# Patient Record
Sex: Female | Born: 1935
Health system: Southern US, Community
[De-identification: ages and names within clinical notes are randomized; demographics above are authoritative.]

## PROBLEM LIST (undated history)

## (undated) DIAGNOSIS — F0281 Dementia in other diseases classified elsewhere with behavioral disturbance: Secondary | ICD-10-CM

## (undated) DIAGNOSIS — I10 Essential (primary) hypertension: Secondary | ICD-10-CM

## (undated) DIAGNOSIS — G309 Alzheimer's disease, unspecified: Secondary | ICD-10-CM

## (undated) DIAGNOSIS — F02818 Dementia in other diseases classified elsewhere, unspecified severity, with other behavioral disturbance: Secondary | ICD-10-CM

## (undated) DIAGNOSIS — C50919 Malignant neoplasm of unspecified site of unspecified female breast: Secondary | ICD-10-CM

## (undated) HISTORY — PX: ABDOMINAL HYSTERECTOMY: SHX81

## (undated) HISTORY — DX: Dementia in other diseases classified elsewhere, unspecified severity, with other behavioral disturbance: F02.818

## (undated) HISTORY — DX: Essential (primary) hypertension: I10

## (undated) HISTORY — DX: Alzheimer's disease, unspecified: G30.9

## (undated) HISTORY — PX: MASTECTOMY: SHX3

## (undated) HISTORY — DX: Malignant neoplasm of unspecified site of unspecified female breast: C50.919

## (undated) HISTORY — PX: BREAST RECONSTRUCTION: SHX9

## (undated) HISTORY — DX: Dementia in other diseases classified elsewhere with behavioral disturbance: F02.81

---
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
## 1983-03-19 HISTORY — PX: BREAST BIOPSY: SHX20

## 2010-11-30 LAB — TB SKIN TEST: TB Skin Test: NEGATIVE mm

## 2011-02-15 ENCOUNTER — Encounter: Payer: Self-pay | Admitting: Internal Medicine

## 2011-02-15 ENCOUNTER — Ambulatory Visit (INDEPENDENT_AMBULATORY_CARE_PROVIDER_SITE_OTHER): Payer: Medicare Other | Admitting: Internal Medicine

## 2011-02-15 VITALS — BP 142/70 | HR 67 | Temp 97.9°F | Wt 120.0 lb

## 2011-02-15 DIAGNOSIS — F0390 Unspecified dementia without behavioral disturbance: Secondary | ICD-10-CM

## 2011-02-15 DIAGNOSIS — Z0000 Encounter for general adult medical examination without abnormal findings: Secondary | ICD-10-CM

## 2011-02-15 DIAGNOSIS — D510 Vitamin B12 deficiency anemia due to intrinsic factor deficiency: Secondary | ICD-10-CM

## 2011-02-15 DIAGNOSIS — E785 Hyperlipidemia, unspecified: Secondary | ICD-10-CM

## 2011-02-15 LAB — CBC WITH DIFFERENTIAL/PLATELET
Basophils Relative: 0.9 % (ref 0.0–3.0)
Eosinophils Absolute: 0.3 10*3/uL (ref 0.0–0.7)
Hemoglobin: 14.5 g/dL (ref 12.0–15.0)
Lymphocytes Relative: 21.9 % (ref 12.0–46.0)
MCHC: 33.7 g/dL (ref 30.0–36.0)
MCV: 95 fl (ref 78.0–100.0)
Neutro Abs: 3.8 10*3/uL (ref 1.4–7.7)
RBC: 4.54 Mil/uL (ref 3.87–5.11)

## 2011-02-15 LAB — COMPREHENSIVE METABOLIC PANEL
AST: 26 U/L (ref 0–37)
BUN: 17 mg/dL (ref 6–23)
Calcium: 9.2 mg/dL (ref 8.4–10.5)
Chloride: 103 mEq/L (ref 96–112)
Creatinine, Ser: 0.7 mg/dL (ref 0.4–1.2)
GFR: 82.02 mL/min (ref >60.00)
Glucose, Bld: 97 mg/dL (ref 70–99)

## 2011-02-15 LAB — LIPID PANEL
Cholesterol: 210 mg/dL — ABNORMAL HIGH (ref 0–200)
HDL: 82.6 mg/dL (ref >39.00)
Triglycerides: 73 mg/dL (ref 0.0–149.0)

## 2011-02-15 LAB — VITAMIN B12: Vitamin B-12: 419 pg/mL (ref 211–911)

## 2011-02-15 NOTE — Progress Notes (Signed)
  Subjective:    Patient ID: Emily Summers, female    DOB: [birth year], 77 y.o.   MRN: [id number]  HPI 77YO female presents to establish care. Denies any complaints today. Oriented to self, but not time or place, so unable to provide information about medications, medical history, etc. Caregiver with her also unable to provide any history.   Review of Systems  Unable to perform ROS: Dementia   BP 142/70  Pulse 67  Temp(Src) 97.9 F (36.6 C) (Oral)  Wt 120 lb (54.432 kg)  SpO2 98%     Objective:   Physical Exam  Constitutional: She appears well-developed and well-nourished. No distress.  HENT:  Head: Normocephalic and atraumatic.  Right Ear: External ear normal.  Left Ear: External ear normal.  Nose: Nose normal.  Mouth/Throat: Oropharynx is clear and moist. No oropharyngeal exudate.  Eyes: Conjunctivae are normal. Pupils are equal, round, and reactive to light. Right eye exhibits no discharge. Left eye exhibits no discharge. No scleral icterus.  Neck: Normal range of motion. Neck supple. No tracheal deviation present. No thyromegaly present.  Cardiovascular: Normal rate, regular rhythm, normal heart sounds and intact distal pulses.  Exam reveals no gallop and no friction rub.   No murmur heard. Pulmonary/Chest: Effort normal and breath sounds normal. No respiratory distress. She has no wheezes. She has no rales. She exhibits no tenderness.  Abdominal: Soft. Bowel sounds are normal. She exhibits no distension and no mass. There is no tenderness. There is no rebound and no guarding.  Musculoskeletal: Normal range of motion. She exhibits no edema and no tenderness.  Lymphadenopathy:    She has no cervical adenopathy.  Neurological: She is alert. No cranial nerve deficit. She exhibits normal muscle tone. Coordination normal.  Skin: Skin is warm and dry. No rash noted. She is not diaphoretic. No erythema. No pallor.  Psychiatric: She has a normal mood and affect. Her speech is normal  and behavior is normal. Thought content is delusional. Cognition and memory are impaired. She expresses inappropriate judgment. She exhibits abnormal recent memory and abnormal remote memory.       Pt reports that she lives in [place] and is just visiting. Notes that nurse with her is a "friend from [place]."           Assessment & Plan:  1. Dementia - No records available for pt at this time. Will request records from previous physician. Pt unable to give medical history. Denies any complaints today. Unsure if she is taking any medication and caregiver with her also unsure.  Will check CBC, CMP, lipids, B12, TSH today. Follow up in 3 months and as needed.

## 2011-02-22 ENCOUNTER — Telehealth: Payer: Self-pay | Admitting: *Deleted

## 2011-02-22 NOTE — Telephone Encounter (Signed)
Someone at nursing facility left VM - pt c/o cough, congestion, low grade fever (99.4) and diarrhea. Need to call caregivers to discuss.

## 2011-02-22 NOTE — Telephone Encounter (Signed)
Left mess for nurse to call office back.

## 2011-02-25 NOTE — Telephone Encounter (Signed)
Pharm called questioning dose of aricept, also says patient is only on namenda 10 mg once daily - Pt needs RFs, will call assisted living facility again to check on patient and get med list.

## 2011-02-26 ENCOUNTER — Telehealth: Payer: Self-pay | Admitting: *Deleted

## 2011-02-26 NOTE — Telephone Encounter (Signed)
Error

## 2011-02-27 MED ORDER — DONEPEZIL HCL 10 MG PO TABS: 10.0000 mg | ORAL_TABLET | Freq: Every evening | ORAL | Status: DC | PRN

## 2011-02-27 MED ORDER — MEMANTINE HCL 10 MG PO TABS: 10.0000 mg | ORAL_TABLET | Freq: Every day | ORAL | Status: DC

## 2011-07-22 DIAGNOSIS — L821 Other seborrheic keratosis: Secondary | ICD-10-CM | POA: Diagnosis not present

## 2011-07-22 DIAGNOSIS — L820 Inflamed seborrheic keratosis: Secondary | ICD-10-CM | POA: Diagnosis not present

## 2011-08-22 ENCOUNTER — Other Ambulatory Visit: Payer: Self-pay | Admitting: Internal Medicine

## 2011-08-29 ENCOUNTER — Other Ambulatory Visit: Payer: Self-pay | Admitting: Internal Medicine

## 2011-11-05 DIAGNOSIS — M2040 Other hammer toe(s) (acquired), unspecified foot: Secondary | ICD-10-CM | POA: Diagnosis not present

## 2011-11-05 DIAGNOSIS — M898X9 Other specified disorders of bone, unspecified site: Secondary | ICD-10-CM | POA: Diagnosis not present

## 2011-11-05 DIAGNOSIS — M2010 Hallux valgus (acquired), unspecified foot: Secondary | ICD-10-CM | POA: Diagnosis not present

## 2011-11-13 DIAGNOSIS — F0390 Unspecified dementia without behavioral disturbance: Secondary | ICD-10-CM | POA: Diagnosis not present

## 2012-03-30 ENCOUNTER — Telehealth: Payer: Self-pay | Admitting: Internal Medicine

## 2012-03-30 NOTE — Telephone Encounter (Signed)
I spoke with Emily Summers and she stated that she faxed the form this morning and she mailed the hard copy this morning.  I advised her that as soon as Dr. Dan Humphreys signs the fax I will fax it back to her today.

## 2012-03-30 NOTE — Telephone Encounter (Signed)
Emily Summers from [facility] is calling about an order that she faxed to the office to be signed by Dr Dan Humphreys.  Caller states she has not received the fax back and she is also looking for the hard copy of the order that she mailed and needs mailed back.  Emily Summers is following up to make sure fax/hard copy was received by the office.  OFFICE PLEASE FOLLOW UP WITH CALLER.

## 2012-05-05 DIAGNOSIS — Z0000 Encounter for general adult medical examination without abnormal findings: Secondary | ICD-10-CM | POA: Diagnosis not present

## 2012-05-05 DIAGNOSIS — Z23 Encounter for immunization: Secondary | ICD-10-CM | POA: Diagnosis not present

## 2012-05-05 DIAGNOSIS — Z136 Encounter for screening for cardiovascular disorders: Secondary | ICD-10-CM | POA: Diagnosis not present

## 2012-05-05 DIAGNOSIS — F0390 Unspecified dementia without behavioral disturbance: Secondary | ICD-10-CM | POA: Diagnosis not present

## 2012-05-13 LAB — HM MAMMOGRAPHY

## 2012-06-15 DIAGNOSIS — M25559 Pain in unspecified hip: Secondary | ICD-10-CM | POA: Diagnosis not present

## 2012-06-15 DIAGNOSIS — F0390 Unspecified dementia without behavioral disturbance: Secondary | ICD-10-CM | POA: Diagnosis not present

## 2012-06-15 DIAGNOSIS — Z23 Encounter for immunization: Secondary | ICD-10-CM | POA: Diagnosis not present

## 2012-06-15 DIAGNOSIS — R110 Nausea: Secondary | ICD-10-CM | POA: Diagnosis not present

## 2012-06-25 ENCOUNTER — Ambulatory Visit: Payer: Self-pay | Admitting: Family Medicine

## 2012-06-25 DIAGNOSIS — M24159 Other articular cartilage disorders, unspecified hip: Secondary | ICD-10-CM | POA: Diagnosis not present

## 2012-06-25 DIAGNOSIS — K6389 Other specified diseases of intestine: Secondary | ICD-10-CM | POA: Diagnosis not present

## 2012-06-25 DIAGNOSIS — M169 Osteoarthritis of hip, unspecified: Secondary | ICD-10-CM | POA: Diagnosis not present

## 2012-06-25 DIAGNOSIS — R110 Nausea: Secondary | ICD-10-CM | POA: Diagnosis not present

## 2012-06-25 DIAGNOSIS — I998 Other disorder of circulatory system: Secondary | ICD-10-CM | POA: Diagnosis not present

## 2012-07-13 DIAGNOSIS — F0390 Unspecified dementia without behavioral disturbance: Secondary | ICD-10-CM | POA: Diagnosis not present

## 2012-07-14 DIAGNOSIS — R300 Dysuria: Secondary | ICD-10-CM | POA: Diagnosis not present

## 2012-07-21 ENCOUNTER — Ambulatory Visit: Payer: Medicare Other | Admitting: Internal Medicine

## 2012-07-27 DIAGNOSIS — F0390 Unspecified dementia without behavioral disturbance: Secondary | ICD-10-CM | POA: Diagnosis not present

## 2012-07-27 DIAGNOSIS — F329 Major depressive disorder, single episode, unspecified: Secondary | ICD-10-CM | POA: Diagnosis not present

## 2012-08-24 DIAGNOSIS — F0390 Unspecified dementia without behavioral disturbance: Secondary | ICD-10-CM | POA: Diagnosis not present

## 2012-09-07 ENCOUNTER — Other Ambulatory Visit: Payer: Self-pay | Admitting: Internal Medicine

## 2012-09-07 NOTE — Telephone Encounter (Signed)
Eprescribed.

## 2012-12-14 DIAGNOSIS — Z23 Encounter for immunization: Secondary | ICD-10-CM | POA: Diagnosis not present

## 2012-12-21 DIAGNOSIS — F329 Major depressive disorder, single episode, unspecified: Secondary | ICD-10-CM | POA: Diagnosis not present

## 2012-12-22 DIAGNOSIS — R350 Frequency of micturition: Secondary | ICD-10-CM | POA: Diagnosis not present

## 2012-12-28 DIAGNOSIS — J309 Allergic rhinitis, unspecified: Secondary | ICD-10-CM | POA: Diagnosis not present

## 2012-12-28 DIAGNOSIS — Z23 Encounter for immunization: Secondary | ICD-10-CM | POA: Diagnosis not present

## 2012-12-28 DIAGNOSIS — R030 Elevated blood-pressure reading, without diagnosis of hypertension: Secondary | ICD-10-CM | POA: Diagnosis not present

## 2012-12-28 DIAGNOSIS — R0609 Other forms of dyspnea: Secondary | ICD-10-CM | POA: Diagnosis not present

## 2012-12-28 LAB — CBC AND DIFFERENTIAL
HEMATOCRIT: 42 % (ref 36–46)
HEMOGLOBIN: 13.9 g/dL (ref 12.0–16.0)
Platelets: 254 10*3/uL (ref 150–399)
WBC: 5.5 10^3/mL

## 2013-01-05 ENCOUNTER — Telehealth: Payer: Self-pay | Admitting: Internal Medicine

## 2013-01-05 NOTE — Telephone Encounter (Signed)
Has faxed a "physician's order" (no further info given regarding order) to be signed by Dr. Dan Humphreys; also mailed a hardcopy.  Physician's order to be signed and returned to [facility].

## 2013-01-06 NOTE — Telephone Encounter (Signed)
No

## 2013-01-06 NOTE — Telephone Encounter (Signed)
Have you seen any paperwork on her?

## 2013-01-07 NOTE — Telephone Encounter (Signed)
Left message to call back on Home health phone line

## 2013-01-11 NOTE — Telephone Encounter (Signed)
Spoke with Pattricia Boss, she state she faxed and mailed this information to the office. She received the fax last week and the hard copy was placed in the mail on Friday with a copy to be scanned into patient chart.

## 2013-03-15 ENCOUNTER — Other Ambulatory Visit: Payer: Self-pay | Admitting: Internal Medicine

## 2013-03-22 DIAGNOSIS — F0390 Unspecified dementia without behavioral disturbance: Secondary | ICD-10-CM | POA: Diagnosis not present

## 2013-03-22 DIAGNOSIS — F3289 Other specified depressive episodes: Secondary | ICD-10-CM | POA: Diagnosis not present

## 2013-03-22 DIAGNOSIS — F0393 Unspecified dementia, unspecified severity, with mood disturbance: Secondary | ICD-10-CM | POA: Diagnosis not present

## 2013-03-22 DIAGNOSIS — F329 Major depressive disorder, single episode, unspecified: Secondary | ICD-10-CM | POA: Diagnosis not present

## 2013-04-12 DIAGNOSIS — Z23 Encounter for immunization: Secondary | ICD-10-CM | POA: Diagnosis not present

## 2013-04-12 DIAGNOSIS — F0390 Unspecified dementia without behavioral disturbance: Secondary | ICD-10-CM | POA: Diagnosis not present

## 2013-04-12 DIAGNOSIS — I10 Essential (primary) hypertension: Secondary | ICD-10-CM | POA: Diagnosis not present

## 2013-04-12 DIAGNOSIS — M1610 Unilateral primary osteoarthritis, unspecified hip: Secondary | ICD-10-CM | POA: Diagnosis not present

## 2013-04-29 ENCOUNTER — Ambulatory Visit: Payer: Self-pay | Admitting: Family Medicine

## 2013-04-29 DIAGNOSIS — M1610 Unilateral primary osteoarthritis, unspecified hip: Secondary | ICD-10-CM | POA: Diagnosis not present

## 2013-04-29 DIAGNOSIS — M25559 Pain in unspecified hip: Secondary | ICD-10-CM | POA: Diagnosis not present

## 2013-04-29 DIAGNOSIS — M169 Osteoarthritis of hip, unspecified: Secondary | ICD-10-CM | POA: Diagnosis not present

## 2013-05-18 DIAGNOSIS — F3289 Other specified depressive episodes: Secondary | ICD-10-CM | POA: Diagnosis not present

## 2013-05-18 DIAGNOSIS — F0393 Unspecified dementia, unspecified severity, with mood disturbance: Secondary | ICD-10-CM | POA: Diagnosis not present

## 2013-05-18 DIAGNOSIS — F329 Major depressive disorder, single episode, unspecified: Secondary | ICD-10-CM | POA: Diagnosis not present

## 2013-05-18 DIAGNOSIS — F0390 Unspecified dementia without behavioral disturbance: Secondary | ICD-10-CM | POA: Diagnosis not present

## 2013-07-08 DIAGNOSIS — F0280 Dementia in other diseases classified elsewhere without behavioral disturbance: Secondary | ICD-10-CM | POA: Diagnosis not present

## 2013-07-08 DIAGNOSIS — M1610 Unilateral primary osteoarthritis, unspecified hip: Secondary | ICD-10-CM | POA: Diagnosis not present

## 2013-07-08 DIAGNOSIS — Z23 Encounter for immunization: Secondary | ICD-10-CM | POA: Diagnosis not present

## 2013-07-08 DIAGNOSIS — F0390 Unspecified dementia without behavioral disturbance: Secondary | ICD-10-CM | POA: Diagnosis not present

## 2013-07-13 DIAGNOSIS — M169 Osteoarthritis of hip, unspecified: Secondary | ICD-10-CM | POA: Diagnosis not present

## 2013-07-13 DIAGNOSIS — I10 Essential (primary) hypertension: Secondary | ICD-10-CM | POA: Diagnosis not present

## 2013-07-13 DIAGNOSIS — M1610 Unilateral primary osteoarthritis, unspecified hip: Secondary | ICD-10-CM | POA: Diagnosis not present

## 2013-07-13 DIAGNOSIS — F0390 Unspecified dementia without behavioral disturbance: Secondary | ICD-10-CM | POA: Diagnosis not present

## 2013-07-13 DIAGNOSIS — Z23 Encounter for immunization: Secondary | ICD-10-CM | POA: Diagnosis not present

## 2013-08-17 DIAGNOSIS — F0393 Unspecified dementia, unspecified severity, with mood disturbance: Secondary | ICD-10-CM | POA: Diagnosis not present

## 2013-08-17 DIAGNOSIS — F329 Major depressive disorder, single episode, unspecified: Secondary | ICD-10-CM | POA: Diagnosis not present

## 2013-08-17 DIAGNOSIS — F3289 Other specified depressive episodes: Secondary | ICD-10-CM | POA: Diagnosis not present

## 2013-08-17 DIAGNOSIS — F0390 Unspecified dementia without behavioral disturbance: Secondary | ICD-10-CM | POA: Diagnosis not present

## 2013-08-24 DIAGNOSIS — R609 Edema, unspecified: Secondary | ICD-10-CM | POA: Diagnosis not present

## 2013-08-24 DIAGNOSIS — M1990 Unspecified osteoarthritis, unspecified site: Secondary | ICD-10-CM | POA: Diagnosis not present

## 2013-08-24 DIAGNOSIS — Z23 Encounter for immunization: Secondary | ICD-10-CM | POA: Diagnosis not present

## 2013-08-24 DIAGNOSIS — I10 Essential (primary) hypertension: Secondary | ICD-10-CM | POA: Diagnosis not present

## 2013-09-21 DIAGNOSIS — R609 Edema, unspecified: Secondary | ICD-10-CM | POA: Diagnosis not present

## 2013-09-21 DIAGNOSIS — Z23 Encounter for immunization: Secondary | ICD-10-CM | POA: Diagnosis not present

## 2013-09-21 DIAGNOSIS — I10 Essential (primary) hypertension: Secondary | ICD-10-CM | POA: Diagnosis not present

## 2013-09-21 DIAGNOSIS — M259 Joint disorder, unspecified: Secondary | ICD-10-CM | POA: Diagnosis not present

## 2013-10-17 DIAGNOSIS — F0280 Dementia in other diseases classified elsewhere without behavioral disturbance: Secondary | ICD-10-CM | POA: Diagnosis not present

## 2013-10-17 DIAGNOSIS — Z23 Encounter for immunization: Secondary | ICD-10-CM | POA: Diagnosis not present

## 2013-10-17 DIAGNOSIS — I10 Essential (primary) hypertension: Secondary | ICD-10-CM | POA: Diagnosis not present

## 2013-10-17 DIAGNOSIS — R609 Edema, unspecified: Secondary | ICD-10-CM | POA: Diagnosis not present

## 2013-10-17 DIAGNOSIS — G309 Alzheimer's disease, unspecified: Secondary | ICD-10-CM | POA: Diagnosis not present

## 2013-12-21 DIAGNOSIS — G309 Alzheimer's disease, unspecified: Secondary | ICD-10-CM | POA: Diagnosis not present

## 2013-12-22 DIAGNOSIS — F0390 Unspecified dementia without behavioral disturbance: Secondary | ICD-10-CM | POA: Diagnosis not present

## 2013-12-22 DIAGNOSIS — I10 Essential (primary) hypertension: Secondary | ICD-10-CM | POA: Diagnosis not present

## 2013-12-22 LAB — BASIC METABOLIC PANEL
BUN: 15 mg/dL (ref 4–21)
CREATININE: 0.8 mg/dL (ref 0.5–1.1)
Glucose: 86 mg/dL
POTASSIUM: 3.7 mmol/L (ref 3.4–5.3)
SODIUM: 141 mmol/L (ref 137–147)

## 2014-01-19 DIAGNOSIS — F0390 Unspecified dementia without behavioral disturbance: Secondary | ICD-10-CM | POA: Diagnosis not present

## 2014-01-19 DIAGNOSIS — G309 Alzheimer's disease, unspecified: Secondary | ICD-10-CM | POA: Diagnosis not present

## 2014-03-16 DIAGNOSIS — G309 Alzheimer's disease, unspecified: Secondary | ICD-10-CM | POA: Diagnosis not present

## 2014-03-16 DIAGNOSIS — F0390 Unspecified dementia without behavioral disturbance: Secondary | ICD-10-CM | POA: Diagnosis not present

## 2014-03-21 DIAGNOSIS — G309 Alzheimer's disease, unspecified: Secondary | ICD-10-CM | POA: Diagnosis not present

## 2014-03-21 DIAGNOSIS — F0390 Unspecified dementia without behavioral disturbance: Secondary | ICD-10-CM | POA: Diagnosis not present

## 2014-06-28 DIAGNOSIS — G309 Alzheimer's disease, unspecified: Secondary | ICD-10-CM | POA: Diagnosis not present

## 2014-06-28 DIAGNOSIS — Z23 Encounter for immunization: Secondary | ICD-10-CM | POA: Diagnosis not present

## 2014-06-28 DIAGNOSIS — I10 Essential (primary) hypertension: Secondary | ICD-10-CM | POA: Diagnosis not present

## 2014-07-06 DIAGNOSIS — G300 Alzheimer's disease with early onset: Secondary | ICD-10-CM | POA: Diagnosis not present

## 2014-07-06 DIAGNOSIS — G309 Alzheimer's disease, unspecified: Secondary | ICD-10-CM | POA: Diagnosis not present

## 2014-07-06 DIAGNOSIS — Z23 Encounter for immunization: Secondary | ICD-10-CM | POA: Diagnosis not present

## 2014-07-15 DIAGNOSIS — G309 Alzheimer's disease, unspecified: Secondary | ICD-10-CM | POA: Diagnosis not present

## 2014-08-18 ENCOUNTER — Other Ambulatory Visit: Payer: Self-pay | Admitting: Family Medicine

## 2014-08-18 NOTE — Telephone Encounter (Signed)
Please call in rx for   TRAMADOL HCL 50MG  TAB]  TAKE ONE TABLET TWICE A DAY, Disp-60 tablet, R-5, Fax

## 2014-08-25 ENCOUNTER — Other Ambulatory Visit: Payer: Self-pay | Admitting: *Deleted

## 2014-08-25 MED ORDER — MEMANTINE HCL 10 MG PO TABS: 10.0000 mg | ORAL_TABLET | Freq: Every day | ORAL | Status: DC

## 2014-08-31 ENCOUNTER — Telehealth: Payer: Self-pay | Admitting: Family Medicine

## 2014-08-31 NOTE — Telephone Encounter (Signed)
Emily Summers or Emily Summers needs verification of strength of Namenda.   Dr. Bridgett Larsson had her on Namenda XR  28 mg. But this is not what you prescribed.  Call back @ [phone]

## 2014-08-31 NOTE — Telephone Encounter (Signed)
She should have this refilled by Dr. Bridgett Larsson. I'm not sure why we got the refill request in the first place, but our records had the 10mg  dose.

## 2014-09-21 ENCOUNTER — Other Ambulatory Visit: Payer: Self-pay | Admitting: *Deleted

## 2014-09-21 MED ORDER — DONEPEZIL HCL 10 MG PO TABS: 10.0000 mg | ORAL_TABLET | Freq: Every evening | ORAL | Status: DC | PRN

## 2014-11-10 ENCOUNTER — Other Ambulatory Visit: Payer: Self-pay | Admitting: Family Medicine

## 2014-12-09 DIAGNOSIS — Z23 Encounter for immunization: Secondary | ICD-10-CM | POA: Diagnosis not present

## 2014-12-21 ENCOUNTER — Telehealth: Payer: Self-pay | Admitting: Family Medicine

## 2014-12-21 NOTE — Telephone Encounter (Signed)
Lori with [facility] is requesting a faxed order due to pt being moved from independent living to memory care on Friday.  Fax [phone].  CB#[phone]

## 2014-12-21 NOTE — Telephone Encounter (Signed)
Order needs to say that it is "ok to transfer patient to memory care".

## 2014-12-21 NOTE — Telephone Encounter (Signed)
Ordered faxed to [facility].

## 2014-12-21 NOTE — Telephone Encounter (Signed)
Please fax order to transfer to memory care as requested. Thanks.

## 2014-12-23 ENCOUNTER — Encounter
Admission: RE | Admit: 2014-12-23 | Discharge: 2014-12-23 | Disposition: A | Discharge status: Home / Self Care | Payer: Medicare Other | Source: Ambulatory Visit | Attending: Internal Medicine | Admitting: Internal Medicine

## 2014-12-23 ENCOUNTER — Other Ambulatory Visit: Payer: Self-pay | Admitting: *Deleted

## 2014-12-23 MED ORDER — POTASSIUM CHLORIDE CRYS ER 20 MEQ PO TBCR: 20.0000 meq | EXTENDED_RELEASE_TABLET | Freq: Every day | ORAL | Status: DC

## 2014-12-26 DIAGNOSIS — J309 Allergic rhinitis, unspecified: Secondary | ICD-10-CM | POA: Insufficient documentation

## 2014-12-26 DIAGNOSIS — R600 Localized edema: Secondary | ICD-10-CM | POA: Insufficient documentation

## 2014-12-26 DIAGNOSIS — M25551 Pain in right hip: Secondary | ICD-10-CM | POA: Insufficient documentation

## 2014-12-26 DIAGNOSIS — Z8619 Personal history of other infectious and parasitic diseases: Secondary | ICD-10-CM | POA: Insufficient documentation

## 2014-12-26 DIAGNOSIS — G309 Alzheimer's disease, unspecified: Secondary | ICD-10-CM | POA: Insufficient documentation

## 2014-12-26 DIAGNOSIS — M169 Osteoarthritis of hip, unspecified: Secondary | ICD-10-CM | POA: Insufficient documentation

## 2014-12-26 DIAGNOSIS — I10 Essential (primary) hypertension: Secondary | ICD-10-CM | POA: Insufficient documentation

## 2014-12-26 DIAGNOSIS — F0281 Dementia in other diseases classified elsewhere with behavioral disturbance: Secondary | ICD-10-CM | POA: Insufficient documentation

## 2014-12-26 DIAGNOSIS — M1990 Unspecified osteoarthritis, unspecified site: Secondary | ICD-10-CM | POA: Insufficient documentation

## 2014-12-26 DIAGNOSIS — Z853 Personal history of malignant neoplasm of breast: Secondary | ICD-10-CM | POA: Insufficient documentation

## 2014-12-27 ENCOUNTER — Ambulatory Visit: Payer: Self-pay | Admitting: Family Medicine

## 2015-01-05 ENCOUNTER — Other Ambulatory Visit
Admission: RE | Admit: 2015-01-05 | Discharge: 2015-01-05 | Disposition: A | Discharge status: Home / Self Care | Payer: Medicare Other | Source: Skilled Nursing Facility | Attending: Internal Medicine | Admitting: Internal Medicine

## 2015-01-05 DIAGNOSIS — I10 Essential (primary) hypertension: Secondary | ICD-10-CM | POA: Insufficient documentation

## 2015-01-05 DIAGNOSIS — C50919 Malignant neoplasm of unspecified site of unspecified female breast: Secondary | ICD-10-CM | POA: Insufficient documentation

## 2015-01-05 DIAGNOSIS — G309 Alzheimer's disease, unspecified: Secondary | ICD-10-CM | POA: Diagnosis not present

## 2015-01-05 LAB — COMPREHENSIVE METABOLIC PANEL
ALBUMIN: 4.2 g/dL (ref 3.5–5.0)
ALK PHOS: 53 U/L (ref 38–126)
ALT: 25 U/L (ref 14–54)
ANION GAP: 8 (ref 5–15)
AST: 28 U/L (ref 15–41)
BILIRUBIN TOTAL: 0.2 mg/dL — AB (ref 0.3–1.2)
BUN: 21 mg/dL — ABNORMAL HIGH (ref 6–20)
CALCIUM: 9.1 mg/dL (ref 8.9–10.3)
CO2: 30 mmol/L (ref 22–32)
Chloride: 99 mmol/L — ABNORMAL LOW (ref 101–111)
Creatinine, Ser: 0.68 mg/dL (ref 0.44–1.00)
GFR calc Af Amer: >60 mL/min (ref >60)
GFR calc non Af Amer: >60 mL/min (ref >60)
GLUCOSE: 86 mg/dL (ref 65–99)
Potassium: 3.3 mmol/L — ABNORMAL LOW (ref 3.5–5.1)
Sodium: 137 mmol/L (ref 135–145)
TOTAL PROTEIN: 6.9 g/dL (ref 6.5–8.1)

## 2015-01-05 LAB — MAGNESIUM: MAGNESIUM: 2.3 mg/dL (ref 1.7–2.4)

## 2015-01-05 LAB — CBC WITH DIFFERENTIAL/PLATELET
BASOS PCT: 1 %
Basophils Absolute: 0.1 10*3/uL (ref 0–0.1)
Eosinophils Absolute: 0.2 10*3/uL (ref 0–0.7)
Eosinophils Relative: 3 %
HEMATOCRIT: 42.4 % (ref 35.0–47.0)
HEMOGLOBIN: 14.1 g/dL (ref 12.0–16.0)
LYMPHS PCT: 23 %
Lymphs Abs: 1.7 10*3/uL (ref 1.0–3.6)
MCH: 30.3 pg (ref 26.0–34.0)
MCHC: 33.4 g/dL (ref 32.0–36.0)
MCV: 90.9 fL (ref 80.0–100.0)
MONOS PCT: 8 %
Monocytes Absolute: 0.6 10*3/uL (ref 0.2–0.9)
NEUTROS ABS: 4.8 10*3/uL (ref 1.4–6.5)
NEUTROS PCT: 65 %
Platelets: 258 10*3/uL (ref 150–440)
RBC: 4.66 MIL/uL (ref 3.80–5.20)
RDW: 14.1 % (ref 11.5–14.5)
WBC: 7.4 10*3/uL (ref 3.6–11.0)

## 2015-01-05 LAB — LIPID PANEL
Cholesterol: 253 mg/dL — ABNORMAL HIGH (ref 0–200)
HDL: 60 mg/dL (ref >40)
LDL Cholesterol: 165 mg/dL — ABNORMAL HIGH (ref 0–99)
Total CHOL/HDL Ratio: 4.2 RATIO
Triglycerides: 142 mg/dL (ref <150)
VLDL: 28 mg/dL (ref 0–40)

## 2015-01-05 LAB — VITAMIN B12: Vitamin B-12: 644 pg/mL (ref 180–914)

## 2015-01-05 LAB — TSH: TSH: 2.674 u[IU]/mL (ref 0.350–4.500)

## 2015-01-06 LAB — VITAMIN D 25 HYDROXY (VIT D DEFICIENCY, FRACTURES): VIT D 25 HYDROXY: 25.5 ng/mL — AB (ref 30.0–100.0)

## 2015-01-09 DIAGNOSIS — G4700 Insomnia, unspecified: Secondary | ICD-10-CM | POA: Diagnosis not present

## 2015-01-09 DIAGNOSIS — G309 Alzheimer's disease, unspecified: Secondary | ICD-10-CM | POA: Diagnosis not present

## 2015-01-30 DIAGNOSIS — G309 Alzheimer's disease, unspecified: Secondary | ICD-10-CM | POA: Diagnosis not present

## 2015-01-30 DIAGNOSIS — G4700 Insomnia, unspecified: Secondary | ICD-10-CM | POA: Diagnosis not present

## 2015-02-16 ENCOUNTER — Encounter
Admission: RE | Admit: 2015-02-16 | Discharge: 2015-02-16 | Disposition: A | Discharge status: Home / Self Care | Payer: Medicare Other | Source: Ambulatory Visit | Attending: Internal Medicine | Admitting: Internal Medicine

## 2015-02-16 DIAGNOSIS — G4700 Insomnia, unspecified: Secondary | ICD-10-CM | POA: Diagnosis not present

## 2015-02-16 DIAGNOSIS — G309 Alzheimer's disease, unspecified: Secondary | ICD-10-CM | POA: Diagnosis not present

## 2015-02-28 DIAGNOSIS — I10 Essential (primary) hypertension: Secondary | ICD-10-CM | POA: Diagnosis not present

## 2015-02-28 DIAGNOSIS — C50919 Malignant neoplasm of unspecified site of unspecified female breast: Secondary | ICD-10-CM | POA: Diagnosis not present

## 2015-02-28 DIAGNOSIS — G309 Alzheimer's disease, unspecified: Secondary | ICD-10-CM | POA: Diagnosis not present

## 2015-03-19 ENCOUNTER — Encounter
Admission: RE | Admit: 2015-03-19 | Discharge: 2015-03-19 | Disposition: A | Discharge status: Home / Self Care | Payer: Medicare Other | Source: Ambulatory Visit | Attending: Internal Medicine | Admitting: Internal Medicine

## 2015-04-19 ENCOUNTER — Encounter
Admission: RE | Admit: 2015-04-19 | Discharge: 2015-04-19 | Disposition: A | Discharge status: Home / Self Care | Payer: Medicare Other | Source: Ambulatory Visit | Attending: Internal Medicine | Admitting: Internal Medicine

## 2015-05-03 DIAGNOSIS — G4700 Insomnia, unspecified: Secondary | ICD-10-CM | POA: Diagnosis not present

## 2015-05-03 DIAGNOSIS — G309 Alzheimer's disease, unspecified: Secondary | ICD-10-CM | POA: Diagnosis not present

## 2015-05-17 ENCOUNTER — Encounter
Admission: RE | Admit: 2015-05-17 | Discharge: 2015-05-17 | Disposition: A | Discharge status: Home / Self Care | Payer: Medicare Other | Source: Ambulatory Visit | Attending: Internal Medicine | Admitting: Internal Medicine

## 2015-05-17 DIAGNOSIS — I10 Essential (primary) hypertension: Secondary | ICD-10-CM | POA: Diagnosis not present

## 2015-05-17 DIAGNOSIS — R410 Disorientation, unspecified: Secondary | ICD-10-CM | POA: Insufficient documentation

## 2015-05-17 DIAGNOSIS — G4700 Insomnia, unspecified: Secondary | ICD-10-CM | POA: Diagnosis not present

## 2015-05-17 DIAGNOSIS — F419 Anxiety disorder, unspecified: Secondary | ICD-10-CM | POA: Diagnosis not present

## 2015-05-17 DIAGNOSIS — G309 Alzheimer's disease, unspecified: Secondary | ICD-10-CM | POA: Diagnosis not present

## 2015-05-17 DIAGNOSIS — G9340 Encephalopathy, unspecified: Secondary | ICD-10-CM | POA: Diagnosis not present

## 2015-05-17 LAB — URINALYSIS COMPLETE WITH MICROSCOPIC (ARMC ONLY)
Bilirubin Urine: NEGATIVE
GLUCOSE, UA: NEGATIVE mg/dL
Hgb urine dipstick: NEGATIVE
KETONES UR: NEGATIVE mg/dL
NITRITE: NEGATIVE
PROTEIN: NEGATIVE mg/dL
SPECIFIC GRAVITY, URINE: 1.02 (ref 1.005–1.030)
pH: 5 (ref 5.0–8.0)

## 2015-05-18 DIAGNOSIS — R410 Disorientation, unspecified: Secondary | ICD-10-CM | POA: Diagnosis not present

## 2015-05-18 LAB — COMPREHENSIVE METABOLIC PANEL
ALT: 33 U/L (ref 14–54)
AST: 30 U/L (ref 15–41)
Albumin: 4 g/dL (ref 3.5–5.0)
Alkaline Phosphatase: 52 U/L (ref 38–126)
Anion gap: 8 (ref 5–15)
BUN: 18 mg/dL (ref 6–20)
CHLORIDE: 98 mmol/L — AB (ref 101–111)
CO2: 33 mmol/L — AB (ref 22–32)
CREATININE: 0.79 mg/dL (ref 0.44–1.00)
Calcium: 9.4 mg/dL (ref 8.9–10.3)
GFR calc Af Amer: >60 mL/min (ref >60)
GFR calc non Af Amer: >60 mL/min (ref >60)
Glucose, Bld: 126 mg/dL — ABNORMAL HIGH (ref 65–99)
POTASSIUM: 3.3 mmol/L — AB (ref 3.5–5.1)
SODIUM: 139 mmol/L (ref 135–145)
Total Bilirubin: 0.6 mg/dL (ref 0.3–1.2)
Total Protein: 6.9 g/dL (ref 6.5–8.1)

## 2015-05-18 LAB — CBC WITH DIFFERENTIAL/PLATELET
BASOS ABS: 0.1 10*3/uL (ref 0–0.1)
BASOS PCT: 1 %
EOS ABS: 0.2 10*3/uL (ref 0–0.7)
EOS PCT: 4 %
HCT: 42.6 % (ref 35.0–47.0)
Hemoglobin: 14.3 g/dL (ref 12.0–16.0)
Lymphocytes Relative: 18 %
Lymphs Abs: 1.1 10*3/uL (ref 1.0–3.6)
MCH: 30 pg (ref 26.0–34.0)
MCHC: 33.5 g/dL (ref 32.0–36.0)
MCV: 89.4 fL (ref 80.0–100.0)
MONO ABS: 0.4 10*3/uL (ref 0.2–0.9)
Monocytes Relative: 7 %
Neutro Abs: 4.5 10*3/uL (ref 1.4–6.5)
Neutrophils Relative %: 70 %
PLATELETS: 259 10*3/uL (ref 150–440)
RBC: 4.77 MIL/uL (ref 3.80–5.20)
RDW: 14 % (ref 11.5–14.5)
WBC: 6.3 10*3/uL (ref 3.6–11.0)

## 2015-05-19 LAB — URINE CULTURE

## 2015-05-24 DIAGNOSIS — F0281 Dementia in other diseases classified elsewhere with behavioral disturbance: Secondary | ICD-10-CM | POA: Diagnosis not present

## 2015-05-24 DIAGNOSIS — G309 Alzheimer's disease, unspecified: Secondary | ICD-10-CM | POA: Diagnosis not present

## 2015-05-24 DIAGNOSIS — Z853 Personal history of malignant neoplasm of breast: Secondary | ICD-10-CM | POA: Diagnosis not present

## 2015-05-31 DIAGNOSIS — Z853 Personal history of malignant neoplasm of breast: Secondary | ICD-10-CM | POA: Diagnosis not present

## 2015-05-31 DIAGNOSIS — F0281 Dementia in other diseases classified elsewhere with behavioral disturbance: Secondary | ICD-10-CM | POA: Diagnosis not present

## 2015-05-31 DIAGNOSIS — G309 Alzheimer's disease, unspecified: Secondary | ICD-10-CM | POA: Diagnosis not present

## 2015-06-01 DIAGNOSIS — F419 Anxiety disorder, unspecified: Secondary | ICD-10-CM | POA: Diagnosis not present

## 2015-06-01 DIAGNOSIS — G309 Alzheimer's disease, unspecified: Secondary | ICD-10-CM | POA: Diagnosis not present

## 2015-06-01 DIAGNOSIS — G4700 Insomnia, unspecified: Secondary | ICD-10-CM | POA: Diagnosis not present

## 2015-06-02 DIAGNOSIS — Z853 Personal history of malignant neoplasm of breast: Secondary | ICD-10-CM | POA: Diagnosis not present

## 2015-06-02 DIAGNOSIS — G309 Alzheimer's disease, unspecified: Secondary | ICD-10-CM | POA: Diagnosis not present

## 2015-06-02 DIAGNOSIS — F0281 Dementia in other diseases classified elsewhere with behavioral disturbance: Secondary | ICD-10-CM | POA: Diagnosis not present

## 2015-06-07 DIAGNOSIS — G309 Alzheimer's disease, unspecified: Secondary | ICD-10-CM | POA: Diagnosis not present

## 2015-06-07 DIAGNOSIS — Z853 Personal history of malignant neoplasm of breast: Secondary | ICD-10-CM | POA: Diagnosis not present

## 2015-06-07 DIAGNOSIS — F0281 Dementia in other diseases classified elsewhere with behavioral disturbance: Secondary | ICD-10-CM | POA: Diagnosis not present

## 2015-06-14 DIAGNOSIS — G309 Alzheimer's disease, unspecified: Secondary | ICD-10-CM | POA: Diagnosis not present

## 2015-06-14 DIAGNOSIS — F0281 Dementia in other diseases classified elsewhere with behavioral disturbance: Secondary | ICD-10-CM | POA: Diagnosis not present

## 2015-06-14 DIAGNOSIS — Z853 Personal history of malignant neoplasm of breast: Secondary | ICD-10-CM | POA: Diagnosis not present

## 2015-06-17 ENCOUNTER — Encounter
Admission: RE | Admit: 2015-06-17 | Discharge: 2015-06-17 | Disposition: A | Discharge status: Home / Self Care | Payer: Medicare Other | Source: Ambulatory Visit | Attending: Internal Medicine | Admitting: Internal Medicine

## 2015-06-17 ENCOUNTER — Encounter: Admission: RE | Admit: 2015-06-17 | Payer: Medicare Other | Source: Ambulatory Visit | Admitting: Internal Medicine

## 2015-06-17 DIAGNOSIS — I10 Essential (primary) hypertension: Secondary | ICD-10-CM | POA: Insufficient documentation

## 2015-06-19 DIAGNOSIS — G309 Alzheimer's disease, unspecified: Secondary | ICD-10-CM | POA: Diagnosis not present

## 2015-06-19 DIAGNOSIS — F0281 Dementia in other diseases classified elsewhere with behavioral disturbance: Secondary | ICD-10-CM | POA: Diagnosis not present

## 2015-06-19 DIAGNOSIS — Z853 Personal history of malignant neoplasm of breast: Secondary | ICD-10-CM | POA: Diagnosis not present

## 2015-06-22 DIAGNOSIS — F419 Anxiety disorder, unspecified: Secondary | ICD-10-CM | POA: Diagnosis not present

## 2015-06-22 DIAGNOSIS — G309 Alzheimer's disease, unspecified: Secondary | ICD-10-CM | POA: Diagnosis not present

## 2015-06-22 DIAGNOSIS — G4700 Insomnia, unspecified: Secondary | ICD-10-CM | POA: Diagnosis not present

## 2015-07-10 DIAGNOSIS — F419 Anxiety disorder, unspecified: Secondary | ICD-10-CM | POA: Diagnosis not present

## 2015-07-10 DIAGNOSIS — G309 Alzheimer's disease, unspecified: Secondary | ICD-10-CM | POA: Diagnosis not present

## 2015-07-10 DIAGNOSIS — G4700 Insomnia, unspecified: Secondary | ICD-10-CM | POA: Diagnosis not present

## 2015-07-13 DIAGNOSIS — I10 Essential (primary) hypertension: Secondary | ICD-10-CM | POA: Diagnosis not present

## 2015-07-13 LAB — BASIC METABOLIC PANEL
Anion gap: 9 (ref 5–15)
Anion gap: 12 (ref 5–15)
BUN: 17 mg/dL (ref 6–20)
BUN: 20 mg/dL (ref 6–20)
CHLORIDE: 101 mmol/L (ref 101–111)
CHLORIDE: 99 mmol/L — AB (ref 101–111)
CO2: 31 mmol/L (ref 22–32)
CO2: 29 mmol/L (ref 22–32)
CREATININE: 0.77 mg/dL (ref 0.44–1.00)
CREATININE: 0.84 mg/dL (ref 0.44–1.00)
Calcium: 9.5 mg/dL (ref 8.9–10.3)
Calcium: 9.6 mg/dL (ref 8.9–10.3)
Glucose, Bld: 79 mg/dL (ref 65–99)
Glucose, Bld: 69 mg/dL (ref 65–99)
POTASSIUM: 3.5 mmol/L (ref 3.5–5.1)
Potassium: 3.9 mmol/L (ref 3.5–5.1)
SODIUM: 141 mmol/L (ref 135–145)
SODIUM: 140 mmol/L (ref 135–145)

## 2015-07-17 ENCOUNTER — Encounter
Admission: RE | Admit: 2015-07-17 | Discharge: 2015-07-17 | Disposition: A | Discharge status: Home / Self Care | Payer: Medicare Other | Source: Ambulatory Visit | Attending: Internal Medicine | Admitting: Internal Medicine

## 2015-08-17 ENCOUNTER — Encounter
Admission: RE | Admit: 2015-08-17 | Discharge: 2015-08-17 | Disposition: A | Discharge status: Home / Self Care | Payer: Medicare Other | Source: Ambulatory Visit | Attending: Internal Medicine | Admitting: Internal Medicine

## 2015-09-16 ENCOUNTER — Encounter
Admission: RE | Admit: 2015-09-16 | Discharge: 2015-09-16 | Disposition: A | Discharge status: Home / Self Care | Payer: Medicare Other | Source: Ambulatory Visit | Attending: Internal Medicine | Admitting: Internal Medicine

## 2015-10-12 DIAGNOSIS — G4700 Insomnia, unspecified: Secondary | ICD-10-CM | POA: Diagnosis not present

## 2015-10-12 DIAGNOSIS — G309 Alzheimer's disease, unspecified: Secondary | ICD-10-CM | POA: Diagnosis not present

## 2015-10-12 DIAGNOSIS — F419 Anxiety disorder, unspecified: Secondary | ICD-10-CM | POA: Diagnosis not present

## 2015-10-17 ENCOUNTER — Encounter
Admission: RE | Admit: 2015-10-17 | Discharge: 2015-10-17 | Disposition: A | Discharge status: Home / Self Care | Payer: Medicare Other | Source: Ambulatory Visit | Attending: Internal Medicine | Admitting: Internal Medicine

## 2015-10-23 DIAGNOSIS — I10 Essential (primary) hypertension: Secondary | ICD-10-CM | POA: Diagnosis not present

## 2015-10-23 DIAGNOSIS — C50919 Malignant neoplasm of unspecified site of unspecified female breast: Secondary | ICD-10-CM | POA: Diagnosis not present

## 2015-10-23 DIAGNOSIS — G308 Other Alzheimer's disease: Secondary | ICD-10-CM | POA: Diagnosis not present

## 2015-11-17 ENCOUNTER — Encounter
Admission: RE | Admit: 2015-11-17 | Discharge: 2015-11-17 | Disposition: A | Discharge status: Home / Self Care | Payer: Medicare Other | Source: Ambulatory Visit | Attending: Internal Medicine | Admitting: Internal Medicine

## 2015-12-06 DIAGNOSIS — G309 Alzheimer's disease, unspecified: Secondary | ICD-10-CM | POA: Diagnosis not present

## 2015-12-06 DIAGNOSIS — F419 Anxiety disorder, unspecified: Secondary | ICD-10-CM | POA: Diagnosis not present

## 2015-12-06 DIAGNOSIS — G4701 Insomnia due to medical condition: Secondary | ICD-10-CM | POA: Diagnosis not present

## 2015-12-06 DIAGNOSIS — F0280 Dementia in other diseases classified elsewhere without behavioral disturbance: Secondary | ICD-10-CM | POA: Diagnosis not present

## 2015-12-17 ENCOUNTER — Encounter
Admission: RE | Admit: 2015-12-17 | Discharge: 2015-12-17 | Disposition: A | Discharge status: Home / Self Care | Payer: Medicare Other | Source: Ambulatory Visit | Attending: Internal Medicine | Admitting: Internal Medicine

## 2016-01-17 ENCOUNTER — Encounter
Admission: RE | Admit: 2016-01-17 | Discharge: 2016-01-17 | Disposition: A | Discharge status: Home / Self Care | Payer: Medicare Other | Source: Ambulatory Visit | Attending: Internal Medicine | Admitting: Internal Medicine

## 2016-02-07 ENCOUNTER — Ambulatory Visit: Payer: Self-pay

## 2016-02-14 DIAGNOSIS — F0280 Dementia in other diseases classified elsewhere without behavioral disturbance: Secondary | ICD-10-CM | POA: Diagnosis not present

## 2016-02-14 DIAGNOSIS — G309 Alzheimer's disease, unspecified: Secondary | ICD-10-CM | POA: Diagnosis not present

## 2016-02-14 DIAGNOSIS — G4701 Insomnia due to medical condition: Secondary | ICD-10-CM | POA: Diagnosis not present

## 2016-02-14 DIAGNOSIS — F419 Anxiety disorder, unspecified: Secondary | ICD-10-CM | POA: Diagnosis not present

## 2016-02-16 ENCOUNTER — Encounter
Admission: RE | Admit: 2016-02-16 | Discharge: 2016-02-16 | Disposition: A | Discharge status: Home / Self Care | Payer: Medicare Other | Source: Ambulatory Visit | Attending: Internal Medicine | Admitting: Internal Medicine

## 2016-03-18 ENCOUNTER — Encounter
Admission: RE | Admit: 2016-03-18 | Discharge: 2016-03-18 | Disposition: A | Discharge status: Home / Self Care | Payer: Medicare Other | Source: Ambulatory Visit | Attending: Internal Medicine | Admitting: Internal Medicine

## 2016-03-22 DIAGNOSIS — G308 Other Alzheimer's disease: Secondary | ICD-10-CM | POA: Diagnosis not present

## 2016-03-22 DIAGNOSIS — Z853 Personal history of malignant neoplasm of breast: Secondary | ICD-10-CM | POA: Diagnosis not present

## 2016-03-22 DIAGNOSIS — I10 Essential (primary) hypertension: Secondary | ICD-10-CM | POA: Diagnosis not present

## 2016-04-18 ENCOUNTER — Encounter
Admission: RE | Admit: 2016-04-18 | Discharge: 2016-04-18 | Disposition: A | Discharge status: Home / Self Care | Payer: Medicare Other | Source: Ambulatory Visit | Attending: Internal Medicine | Admitting: Internal Medicine

## 2016-05-16 ENCOUNTER — Encounter
Admission: RE | Admit: 2016-05-16 | Discharge: 2016-05-16 | Disposition: A | Discharge status: Home / Self Care | Payer: Medicare Other | Source: Ambulatory Visit | Attending: Internal Medicine | Admitting: Internal Medicine

## 2016-06-16 ENCOUNTER — Encounter
Admission: RE | Admit: 2016-06-16 | Discharge: 2016-06-16 | Disposition: A | Discharge status: Home / Self Care | Payer: Medicare Other | Source: Ambulatory Visit | Attending: Internal Medicine | Admitting: Internal Medicine

## 2016-06-19 ENCOUNTER — Non-Acute Institutional Stay: Payer: Medicare Other | Admitting: Gerontology

## 2016-06-19 DIAGNOSIS — M1990 Unspecified osteoarthritis, unspecified site: Secondary | ICD-10-CM

## 2016-06-19 DIAGNOSIS — F0280 Dementia in other diseases classified elsewhere without behavioral disturbance: Secondary | ICD-10-CM

## 2016-06-19 DIAGNOSIS — G301 Alzheimer's disease with late onset: Secondary | ICD-10-CM

## 2016-06-19 DIAGNOSIS — I10 Essential (primary) hypertension: Secondary | ICD-10-CM

## 2016-06-19 DIAGNOSIS — J309 Allergic rhinitis, unspecified: Secondary | ICD-10-CM | POA: Diagnosis not present

## 2016-06-19 DIAGNOSIS — Z853 Personal history of malignant neoplasm of breast: Secondary | ICD-10-CM | POA: Diagnosis not present

## 2016-06-19 DIAGNOSIS — Z9183 Wandering in diseases classified elsewhere: Secondary | ICD-10-CM

## 2016-06-19 DIAGNOSIS — F329 Major depressive disorder, single episode, unspecified: Secondary | ICD-10-CM | POA: Diagnosis not present

## 2016-06-19 NOTE — Progress Notes (Signed)
Location:      Place of Service:  ALF (13) Provider:  Toni Arthurs, NP-C  No primary care provider on file.  No care team member to display  Extended Emergency Contact Information Primary Emergency Contact: [place] Phone: [phone] Relation: Friend Secondary Emergency Contact: SARFATI,ELIZABETH Address: [place] Phone: [phone] Relation: None  Code Status:  FULL Goals of care: Advanced Directive information No flowsheet data found.   Chief Complaint  Patient presents with  . Medical Management of Chronic Issues    HPI:  Pt is a 83 y.o. female seen today for medical management of chronic diseases. Pt has been stable over the past month. No falls. Pt is continent of B&B. Needs assistance with ADLs. Likes to read, but also tears pages out of magazines. Good appetite. Pt has limited attention span d/t Dementia. Limited ability to obtain complete ROS. Overall doing well. VSS. No other complaints.     Past Medical History:  Diagnosis Date  . Breast cancer [facility])    Past Surgical History:  Procedure Laterality Date  . ABDOMINAL HYSTERECTOMY     Still have ovaries  . BREAST BIOPSY Left 1985  . BREAST RECONSTRUCTION Left   . MASTECTOMY     Right  . TONSILLECTOMY  1942    Allergies  Allergen Reactions  . Sulfa Antibiotics     Allergies as of 06/19/2016      Reactions   Sulfa Antibiotics       Medication List       Accurate as of 06/19/16  2:16 PM. Always use your most recent med list.          amLODipine 2.5 MG tablet Commonly known as:  NORVASC Take 1 tablet by mouth daily.   donepezil 10 MG tablet Commonly known as:  ARICEPT Take 1 tablet (10 mg total) by mouth at bedtime as needed.   gabapentin 100 MG capsule Commonly known as:  NEURONTIN Take 1 capsule by mouth 3 (three) times daily.   hydrochlorothiazide 25 MG tablet Commonly known as:  HYDRODIURIL TAKE 1 TABLET EVERY DAY USUALLY  IN THE MORNING   meloxicam 15 MG tablet Commonly known as:  MOBIC Take 1 tablet by mouth daily.   memantine 10 MG tablet Commonly known as:  NAMENDA Take 1 tablet (10 mg total) by mouth daily.   potassium chloride SA 20 MEQ tablet Commonly known as:  K-DUR,KLOR-CON Take 1 tablet (20 mEq total) by mouth daily.   PRESERVISION AREDS 2 Caps Take 1 capsule by mouth 2 (two) times daily.   CENTRUM SILVER tablet Take 1 tablet by mouth daily.   traMADol 50 MG tablet Commonly known as:  ULTRAM TAKE ONE TABLET TWICE A DAY       Review of Systems  Unable to perform ROS: Dementia  Constitutional: Negative for activity change, appetite change, chills, diaphoresis and fever.  HENT: Negative for congestion, sneezing, sore throat, trouble swallowing and voice change.   Respiratory: Negative for apnea, cough, choking, chest tightness, shortness of breath and wheezing.   Cardiovascular: Negative for chest pain, palpitations and leg swelling.  Gastrointestinal: Negative for abdominal distention, abdominal pain, constipation, diarrhea and nausea.  Genitourinary: Negative for difficulty urinating, dysuria, frequency and urgency.  Musculoskeletal: Positive for arthralgias (typical arthritis). Negative for back pain, gait problem and myalgias.  Skin: Negative for color change, pallor, rash and wound.  Neurological: Negative for dizziness, tremors, syncope, speech  difficulty, weakness, numbness and headaches.  Psychiatric/Behavioral: Positive for agitation (at times). Negative for behavioral problems.  All other systems reviewed and are negative.   Immunization History  Administered Date(s) Administered  . Influenza Split 01/15/2011  . Influenza-Unspecified 12/16/2012  . Pneumococcal Conjugate-13 06/28/2014  . Pneumococcal Polysaccharide-23 05/05/2012  . Zoster 01/20/2006   Pertinent  Health Maintenance Due  Topic Date Due  . DEXA SCAN  06/07/1998  . INFLUENZA VACCINE  10/16/2016  . PNA  vac Low Risk Adult  Completed   No flowsheet data found. Functional Status Survey:    Vitals:   06/14/16 0800  BP: (!) 148/68  Pulse: 64  Weight: 160 lb 4.8 oz (72.7 kg)   There is no height or weight on file to calculate BMI. Physical Exam  Constitutional: She is oriented to person, place, and time. Vital signs are normal. She appears well-developed and well-nourished. She is active and cooperative. She does not appear ill. No distress.  HENT:  Head: Normocephalic and atraumatic.  Mouth/Throat: Uvula is midline, oropharynx is clear and moist and mucous membranes are normal. Mucous membranes are not pale, not dry and not cyanotic.  Eyes: Conjunctivae, EOM and lids are normal. Pupils are equal, round, and reactive to light.  Neck: Trachea normal, normal range of motion and full passive range of motion without pain. Neck supple. No JVD present. No tracheal deviation, no edema and no erythema present. No thyromegaly present.  Cardiovascular: Normal rate, regular rhythm, normal heart sounds, intact distal pulses and normal pulses.  Exam reveals no gallop, no distant heart sounds and no friction rub.   No murmur heard. Pulmonary/Chest: Effort normal and breath sounds normal. No accessory muscle usage. No respiratory distress. She has no wheezes. She has no rales. She exhibits no tenderness.  Abdominal: Normal appearance and bowel sounds are normal. She exhibits no distension and no ascites. There is no tenderness.  Musculoskeletal: Normal range of motion. She exhibits no edema or tenderness.  Expected osteoarthritis, stiffness  Neurological: She is alert and oriented to person, place, and time. She has normal strength.  Skin: Skin is warm, dry and intact. No rash noted. She is not diaphoretic. No cyanosis or erythema. No pallor. Nails show no clubbing.  Psychiatric: She has a normal mood and affect. Her speech is normal. Judgment and thought content normal. Cognition and memory are impaired.  She is inattentive.  Nursing note and vitals reviewed.   Labs reviewed:  Recent Labs  07/13/15 0718 07/13/15 1400  NA 141 140  K 3.5 3.9  CL 101 99*  CO2 31 29  GLUCOSE 79 69  BUN 17 20  CREATININE 0.77 0.84  CALCIUM 9.5 9.6   No results for input(s): AST, ALT, ALKPHOS, BILITOT, PROT, ALBUMIN in the last 8760 hours. No results for input(s): WBC, NEUTROABS, HGB, HCT, MCV, PLT in the last 8760 hours. Lab Results  Component Value Date   TSH 2.674 01/05/2015   No results found for: HGBA1C Lab Results  Component Value Date   CHOL 253 (H) 01/05/2015   HDL 60 01/05/2015   LDLCALC 165 (H) 01/05/2015   LDLDIRECT 110.8 02/15/2011   TRIG 142 01/05/2015   CHOLHDL 4.2 01/05/2015    Significant Diagnostic Results in last 30 days:  No results found.  Assessment/Plan 1. Late onset Alzheimer's disease without behavioral disturbance  Stable  Continue memantine 5 mg po BID  Continue lorazepam 0.5 mg po Q Day  Continue Lorazepam 0.5 mg po Q 4 hours prn anxiety,  agitation  Continue Cerovite senior MVI PO Q Day  2. Wandering in diseases classified elsewhere  Stable  Safety precautions  Fall precautions  3. Allergic rhinitis, unspecified chronicity, unspecified seasonality, unspecified trigger  stable  4. Personal history of malignant neoplasm of breast  stable  5. Osteoarthritis, unspecified osteoarthritis type, unspecified site  Stable  Continue tramadol 50 mg po BID  6. Essential hypertension  Stable  Continue amlodipine 2.5 mg po Q Day  Continue Hydrochlorothiazide 25 mg po Q Day  Continue potassium chloride 20 meq BID  7. Major depressive disorder with single episode, remission status unspecified  Stable  Continue Sertraline 100 mg po Qday  Continue Trazodone 50 mg po Q HS  Family/ staff Communication:   Total Time:  Documentation:  Face to Face:  Family/Phone:   Labs/tests ordered:  Cbc, met c B12, D, TSH, Mag+  Medication list  reviewed and assessed for continued appropriateness. Monthly medication orders reviewed and signed.  Vikki Ports, NP-C Geriatrics [facility] [phone], [place] [address] Cell Phone (Mon-Fri 8am-5pm):  [phone] On Call:  [phone] & follow prompts after 5pm & weekends Office Phone:  [phone] Office Fax:  [phone]

## 2016-06-20 ENCOUNTER — Other Ambulatory Visit
Admission: RE | Admit: 2016-06-20 | Discharge: 2016-06-20 | Disposition: A | Discharge status: Home / Self Care | Payer: Medicare Other | Source: Ambulatory Visit | Attending: Gerontology | Admitting: Gerontology

## 2016-06-20 DIAGNOSIS — G309 Alzheimer's disease, unspecified: Secondary | ICD-10-CM | POA: Diagnosis not present

## 2016-06-20 LAB — COMPREHENSIVE METABOLIC PANEL
ALBUMIN: 3.6 g/dL (ref 3.5–5.0)
ALT: 13 U/L — AB (ref 14–54)
AST: 23 U/L (ref 15–41)
Alkaline Phosphatase: 47 U/L (ref 38–126)
Anion gap: 8 (ref 5–15)
BUN: 20 mg/dL (ref 6–20)
CHLORIDE: 102 mmol/L (ref 101–111)
CO2: 30 mmol/L (ref 22–32)
CREATININE: 0.71 mg/dL (ref 0.44–1.00)
Calcium: 8.9 mg/dL (ref 8.9–10.3)
GFR calc non Af Amer: >60 mL/min (ref >60)
Glucose, Bld: 71 mg/dL (ref 65–99)
Potassium: 3.2 mmol/L — ABNORMAL LOW (ref 3.5–5.1)
SODIUM: 140 mmol/L (ref 135–145)
Total Bilirubin: 0.5 mg/dL (ref 0.3–1.2)
Total Protein: 6.5 g/dL (ref 6.5–8.1)

## 2016-06-20 LAB — CBC WITH DIFFERENTIAL/PLATELET
BASOS ABS: 0.1 10*3/uL (ref 0–0.1)
BASOS PCT: 1 %
EOS ABS: 0.3 10*3/uL (ref 0–0.7)
Eosinophils Relative: 4 %
HCT: 39.9 % (ref 35.0–47.0)
HEMOGLOBIN: 13.3 g/dL (ref 12.0–16.0)
Lymphocytes Relative: 29 %
Lymphs Abs: 1.8 10*3/uL (ref 1.0–3.6)
MCH: 30.2 pg (ref 26.0–34.0)
MCHC: 33.4 g/dL (ref 32.0–36.0)
MCV: 90.4 fL (ref 80.0–100.0)
Monocytes Absolute: 0.6 10*3/uL (ref 0.2–0.9)
Monocytes Relative: 9 %
NEUTROS PCT: 57 %
Neutro Abs: 3.5 10*3/uL (ref 1.4–6.5)
Platelets: 262 10*3/uL (ref 150–440)
RBC: 4.41 MIL/uL (ref 3.80–5.20)
RDW: 14.2 % (ref 11.5–14.5)
WBC: 6.2 10*3/uL (ref 3.6–11.0)

## 2016-06-20 LAB — VITAMIN B12: VITAMIN B 12: 657 pg/mL (ref 180–914)

## 2016-06-20 LAB — MAGNESIUM: MAGNESIUM: 2.2 mg/dL (ref 1.7–2.4)

## 2016-06-20 LAB — TSH: TSH: 3.31 u[IU]/mL (ref 0.350–4.500)

## 2016-06-21 LAB — VITAMIN D 25 HYDROXY (VIT D DEFICIENCY, FRACTURES): Vit D, 25-Hydroxy: 24.3 ng/mL — ABNORMAL LOW (ref 30.0–100.0)

## 2016-07-16 ENCOUNTER — Non-Acute Institutional Stay: Payer: Medicare Other | Admitting: Gerontology

## 2016-07-16 ENCOUNTER — Encounter
Admission: RE | Admit: 2016-07-16 | Discharge: 2016-07-16 | Disposition: A | Discharge status: Home / Self Care | Payer: Medicare Other | Source: Ambulatory Visit | Attending: Internal Medicine | Admitting: Internal Medicine

## 2016-07-16 DIAGNOSIS — J309 Allergic rhinitis, unspecified: Secondary | ICD-10-CM | POA: Diagnosis not present

## 2016-07-16 DIAGNOSIS — Z853 Personal history of malignant neoplasm of breast: Secondary | ICD-10-CM

## 2016-07-16 DIAGNOSIS — Z9183 Wandering in diseases classified elsewhere: Secondary | ICD-10-CM | POA: Diagnosis not present

## 2016-07-16 DIAGNOSIS — F329 Major depressive disorder, single episode, unspecified: Secondary | ICD-10-CM | POA: Diagnosis not present

## 2016-07-16 DIAGNOSIS — M1990 Unspecified osteoarthritis, unspecified site: Secondary | ICD-10-CM | POA: Diagnosis not present

## 2016-07-16 DIAGNOSIS — F0280 Dementia in other diseases classified elsewhere without behavioral disturbance: Secondary | ICD-10-CM

## 2016-07-16 DIAGNOSIS — I10 Essential (primary) hypertension: Secondary | ICD-10-CM | POA: Diagnosis not present

## 2016-07-16 DIAGNOSIS — G301 Alzheimer's disease with late onset: Secondary | ICD-10-CM

## 2016-08-01 NOTE — Progress Notes (Signed)
Location:      Place of Service:  ALF (13) Provider:  Toni Arthurs, NP-C  Toni Arthurs, NP  Patient Care Team: Toni Arthurs, NP as PCP - General (Family Medicine)  Extended Emergency Contact Information Primary Emergency Contact: Estelle Grumbles [place] of [place] Phone: [phone] Relation: Friend Secondary Emergency Contact: SARFATI,ELIZABETH Address: [place] Phone: [phone] Relation: None  Code Status:  FULL Goals of care: Advanced Directive information No flowsheet data found.   Chief Complaint  Patient presents with  . Medical Management of Chronic Issues    HPI:  Pt is a 83 y.o. female seen today for medical management of chronic diseases. Pt has been stable over the past month. Pt did have 1 fall this past month. She was found laying on the floor beside her bed, but denies falling. Pt is continent of B&B. Needs assistance with ADLs. Likes to read, but also tears pages out of magazines. Good appetite. Pt has limited attention span d/t Dementia. Limited ability to obtain complete ROS. Overall doing well. VSS. No other complaints.     Past Medical History:  Diagnosis Date  . Breast cancer [facility])    Past Surgical History:  Procedure Laterality Date  . ABDOMINAL HYSTERECTOMY     Still have ovaries  . BREAST BIOPSY Left 1985  . BREAST RECONSTRUCTION Left   . MASTECTOMY     Right  . TONSILLECTOMY  1942    Allergies  Allergen Reactions  . Sulfa Antibiotics     Allergies as of 07/16/2016      Reactions   Sulfa Antibiotics       Medication List       Accurate as of 07/16/16 11:59 PM. Always use your most recent med list.          amLODipine 2.5 MG tablet Commonly known as:  NORVASC Take 1 tablet by mouth daily.   donepezil 10 MG tablet Commonly known as:  ARICEPT Take 1 tablet (10 mg total) by mouth at bedtime as needed.   gabapentin 100 MG capsule Commonly known as:  NEURONTIN Take 1 capsule by mouth 3  (three) times daily.   hydrochlorothiazide 25 MG tablet Commonly known as:  HYDRODIURIL TAKE 1 TABLET EVERY DAY USUALLY IN THE MORNING   meloxicam 15 MG tablet Commonly known as:  MOBIC Take 1 tablet by mouth daily.   memantine 10 MG tablet Commonly known as:  NAMENDA Take 1 tablet (10 mg total) by mouth daily.   potassium chloride SA 20 MEQ tablet Commonly known as:  K-DUR,KLOR-CON Take 1 tablet (20 mEq total) by mouth daily.   PRESERVISION AREDS 2 Caps Take 1 capsule by mouth 2 (two) times daily.   CENTRUM SILVER tablet Take 1 tablet by mouth daily.   traMADol 50 MG tablet Commonly known as:  ULTRAM TAKE ONE TABLET TWICE A DAY       Review of Systems  Unable to perform ROS: Dementia  Constitutional: Negative for activity change, appetite change, chills, diaphoresis and fever.  HENT: Negative for congestion, sneezing, sore throat, trouble swallowing and voice change.   Respiratory: Negative for apnea, cough, choking, chest tightness, shortness of breath and wheezing.   Cardiovascular: Negative for chest pain, palpitations and leg swelling.  Gastrointestinal: Negative for abdominal distention, abdominal pain, constipation, diarrhea and nausea.  Genitourinary: Negative for difficulty urinating, dysuria, frequency and urgency.  Musculoskeletal: Positive for arthralgias (typical arthritis). Negative for back pain, gait  problem and myalgias.  Skin: Negative for color change, pallor, rash and wound.  Neurological: Negative for dizziness, tremors, syncope, speech difficulty, weakness, numbness and headaches.  Psychiatric/Behavioral: Positive for agitation (at times). Negative for behavioral problems.  All other systems reviewed and are negative.   Immunization History  Administered Date(s) Administered  . Influenza Split 01/15/2011  . Influenza-Unspecified 12/16/2012  . Pneumococcal Conjugate-13 06/28/2014  . Pneumococcal Polysaccharide-23 05/05/2012  . Zoster 01/20/2006    Pertinent  Health Maintenance Due  Topic Date Due  . DEXA SCAN  06/07/1998  . INFLUENZA VACCINE  10/16/2016  . PNA vac Low Risk Adult  Completed   No flowsheet data found. Functional Status Survey:    Vitals:   07/12/16 1730  BP: (!) 141/63  Pulse: 78   There is no height or weight on file to calculate BMI. Physical Exam  Constitutional: She is oriented to person, place, and time. Vital signs are normal. She appears well-developed and well-nourished. She is active and cooperative. She does not appear ill. No distress.  HENT:  Head: Normocephalic and atraumatic.  Mouth/Throat: Uvula is midline, oropharynx is clear and moist and mucous membranes are normal. Mucous membranes are not pale, not dry and not cyanotic.  Eyes: Conjunctivae, EOM and lids are normal. Pupils are equal, round, and reactive to light.  Neck: Trachea normal, normal range of motion and full passive range of motion without pain. Neck supple. No JVD present. No tracheal deviation, no edema and no erythema present. No thyromegaly present.  Cardiovascular: Normal rate, regular rhythm, normal heart sounds, intact distal pulses and normal pulses.  Exam reveals no gallop, no distant heart sounds and no friction rub.   No murmur heard. Pulmonary/Chest: Effort normal and breath sounds normal. No accessory muscle usage. No respiratory distress. She has no wheezes. She has no rales. She exhibits no tenderness.  Abdominal: Normal appearance and bowel sounds are normal. She exhibits no distension and no ascites. There is no tenderness.  Musculoskeletal: Normal range of motion. She exhibits no edema or tenderness.  Expected osteoarthritis, stiffness  Neurological: She is alert and oriented to person, place, and time. She has normal strength.  Skin: Skin is warm, dry and intact. No rash noted. She is not diaphoretic. No cyanosis or erythema. No pallor. Nails show no clubbing.  Psychiatric: She has a normal mood and affect. Her  speech is normal. Judgment and thought content normal. Cognition and memory are impaired. She is inattentive.  Nursing note and vitals reviewed.   Labs reviewed:  Recent Labs  06/20/16 0430  NA 140  K 3.2*  CL 102  CO2 30  GLUCOSE 71  BUN 20  CREATININE 0.71  CALCIUM 8.9  MG 2.2    Recent Labs  06/20/16 0430  AST 23  ALT 13*  ALKPHOS 47  BILITOT 0.5  PROT 6.5  ALBUMIN 3.6    Recent Labs  06/20/16 0430  WBC 6.2  NEUTROABS 3.5  HGB 13.3  HCT 39.9  MCV 90.4  PLT 262   Lab Results  Component Value Date   TSH 3.310 06/20/2016   No results found for: HGBA1C Lab Results  Component Value Date   CHOL 253 (H) 01/05/2015   HDL 60 01/05/2015   LDLCALC 165 (H) 01/05/2015   LDLDIRECT 110.8 02/15/2011   TRIG 142 01/05/2015   CHOLHDL 4.2 01/05/2015    Significant Diagnostic Results in last 30 days:  No results found.  Assessment/Plan 1. Late onset Alzheimer's disease without behavioral disturbance  Stable  Continue memantine 5 mg po BID  Continue lorazepam 0.5 mg po Q Day  Continue Lorazepam 0.5 mg po Q 4 hours prn anxiety, agitation  Continue Cerovite senior MVI PO Q Day  2. Wandering in diseases classified elsewhere  Stable  Safety precautions  Fall precautions  3. Allergic rhinitis, unspecified chronicity, unspecified seasonality, unspecified trigger  stable  4. Personal history of malignant neoplasm of breast  stable  5. Osteoarthritis, unspecified osteoarthritis type, unspecified site  Stable  Continue tramadol 50 mg po BID  6. Essential hypertension  Stable  Continue amlodipine 2.5 mg po Q Day  Continue Hydrochlorothiazide 25 mg po Q Day  Continue potassium chloride 20 meq BID  7. Major depressive disorder with single episode, remission status unspecified  Stable  Continue Sertraline 100 mg po Qday  Continue Trazodone 50 mg po Q HS  Family/ staff Communication:   Total Time:  Documentation:  Face to  Face:  Family/Phone:   Labs/tests ordered:  Cbc, met c B12, D, TSH, Mag+  Medication list reviewed and assessed for continued appropriateness. Monthly medication orders reviewed and signed.  Vikki Ports, NP-C Geriatrics [facility] [phone], [place] [address] Cell Phone (Mon-Fri 8am-5pm):  [phone] On Call:  [phone] & follow prompts after 5pm & weekends Office Phone:  [phone] Office Fax:  [phone]

## 2016-08-15 DIAGNOSIS — Z853 Personal history of malignant neoplasm of breast: Secondary | ICD-10-CM | POA: Diagnosis not present

## 2016-08-15 DIAGNOSIS — I10 Essential (primary) hypertension: Secondary | ICD-10-CM | POA: Diagnosis not present

## 2016-08-15 DIAGNOSIS — F0281 Dementia in other diseases classified elsewhere with behavioral disturbance: Secondary | ICD-10-CM | POA: Diagnosis not present

## 2016-08-15 DIAGNOSIS — G301 Alzheimer's disease with late onset: Secondary | ICD-10-CM | POA: Diagnosis not present

## 2016-08-16 ENCOUNTER — Encounter
Admission: RE | Admit: 2016-08-16 | Discharge: 2016-08-16 | Disposition: A | Discharge status: Home / Self Care | Payer: Medicare Other | Source: Ambulatory Visit | Attending: Internal Medicine | Admitting: Internal Medicine

## 2016-08-21 ENCOUNTER — Other Ambulatory Visit: Payer: Self-pay | Admitting: Gerontology

## 2016-08-21 DIAGNOSIS — G301 Alzheimer's disease with late onset: Principal | ICD-10-CM

## 2016-08-21 DIAGNOSIS — Z9183 Wandering in diseases classified elsewhere: Secondary | ICD-10-CM

## 2016-08-21 DIAGNOSIS — J309 Allergic rhinitis, unspecified: Secondary | ICD-10-CM

## 2016-08-21 DIAGNOSIS — I10 Essential (primary) hypertension: Secondary | ICD-10-CM

## 2016-08-21 DIAGNOSIS — Z853 Personal history of malignant neoplasm of breast: Secondary | ICD-10-CM

## 2016-08-21 DIAGNOSIS — M1990 Unspecified osteoarthritis, unspecified site: Secondary | ICD-10-CM

## 2016-08-21 DIAGNOSIS — F0280 Dementia in other diseases classified elsewhere without behavioral disturbance: Secondary | ICD-10-CM

## 2016-08-21 DIAGNOSIS — F329 Major depressive disorder, single episode, unspecified: Secondary | ICD-10-CM

## 2016-09-08 NOTE — Progress Notes (Signed)
Location:      Place of Service:    Provider:  Toni Arthurs, NP-C  Toni Arthurs, NP  Patient Care Team: Toni Arthurs, NP as PCP - General (Family Medicine)  Extended Emergency Contact Information Primary Emergency Contact: Estelle Grumbles [place] of [place] Phone: [phone] Relation: Friend Secondary Emergency Contact: SARFATI,ELIZABETH Address: [place] Phone: [phone] Relation: None  Code Status:  FULL Goals of care: Advanced Directive information No flowsheet data found.   Chief Complaint  Patient presents with  . Medical Management of Chronic Issues    HPI:  Pt is a 83 y.o. female seen today for medical management of chronic diseases. Pt has been stable over the past month. Pt has not had any reported falls this past month. Pt is incontinent of B&B. Needs assistance with ADLs. Likes to read, but also tears pages out of magazines. Enjoys caring for her baby-doll. Good appetite. Pt has limited attention span d/t Dementia. Limited ability to obtain complete ROS. Overall doing well. VSS. No other complaints.     Past Medical History:  Diagnosis Date  . Breast cancer [facility])    Past Surgical History:  Procedure Laterality Date  . ABDOMINAL HYSTERECTOMY     Still have ovaries  . BREAST BIOPSY Left 1985  . BREAST RECONSTRUCTION Left   . MASTECTOMY     Right  . TONSILLECTOMY  1942    Allergies  Allergen Reactions  . Sulfa Antibiotics     Allergies as of 08/21/2016      Reactions   Sulfa Antibiotics       Medication List       Accurate as of 08/21/16 11:59 PM. Always use your most recent med list.          amLODipine 2.5 MG tablet Commonly known as:  NORVASC Take 1 tablet by mouth daily.   donepezil 10 MG tablet Commonly known as:  ARICEPT Take 1 tablet (10 mg total) by mouth at bedtime as needed.   gabapentin 100 MG capsule Commonly known as:  NEURONTIN Take 1 capsule by mouth 3 (three) times daily.     hydrochlorothiazide 25 MG tablet Commonly known as:  HYDRODIURIL TAKE 1 TABLET EVERY DAY USUALLY IN THE MORNING   meloxicam 15 MG tablet Commonly known as:  MOBIC Take 1 tablet by mouth daily.   memantine 10 MG tablet Commonly known as:  NAMENDA Take 1 tablet (10 mg total) by mouth daily.   potassium chloride SA 20 MEQ tablet Commonly known as:  K-DUR,KLOR-CON Take 1 tablet (20 mEq total) by mouth daily.   PRESERVISION AREDS 2 Caps Take 1 capsule by mouth 2 (two) times daily.   CENTRUM SILVER tablet Take 1 tablet by mouth daily.   traMADol 50 MG tablet Commonly known as:  ULTRAM TAKE ONE TABLET TWICE A DAY       Review of Systems  Unable to perform ROS: Dementia  Constitutional: Negative for activity change, appetite change, chills, diaphoresis and fever.  HENT: Negative for congestion, sneezing, sore throat, trouble swallowing and voice change.   Respiratory: Negative for apnea, cough, choking, chest tightness, shortness of breath and wheezing.   Cardiovascular: Negative for chest pain, palpitations and leg swelling.  Gastrointestinal: Negative for abdominal distention, abdominal pain, constipation, diarrhea and nausea.  Genitourinary: Negative for difficulty urinating, dysuria, frequency and urgency.  Musculoskeletal: Positive for arthralgias (typical arthritis). Negative for back pain, gait problem and myalgias.  Skin:  Negative for color change, pallor, rash and wound.  Neurological: Negative for dizziness, tremors, syncope, speech difficulty, weakness, numbness and headaches.  Psychiatric/Behavioral: Positive for agitation (at times). Negative for behavioral problems.  All other systems reviewed and are negative.   Immunization History  Administered Date(s) Administered  . Influenza Split 01/15/2011  . Influenza-Unspecified 12/16/2012  . Pneumococcal Conjugate-13 06/28/2014  . Pneumococcal Polysaccharide-23 05/05/2012  . Zoster 01/20/2006   Pertinent  Health  Maintenance Due  Topic Date Due  . DEXA SCAN  06/07/1998  . INFLUENZA VACCINE  10/16/2016  . PNA vac Low Risk Adult  Completed   No flowsheet data found. Functional Status Survey:    Vitals:   08/20/16 2100  BP: 139/73  Pulse: 73   There is no height or weight on file to calculate BMI. Physical Exam  Constitutional: She is oriented to person, place, and time. Vital signs are normal. She appears well-developed and well-nourished. She is active and cooperative. She does not appear ill. No distress.  HENT:  Head: Normocephalic and atraumatic.  Mouth/Throat: Uvula is midline, oropharynx is clear and moist and mucous membranes are normal. Mucous membranes are not pale, not dry and not cyanotic.  Eyes: Conjunctivae, EOM and lids are normal. Pupils are equal, round, and reactive to light.  Neck: Trachea normal, normal range of motion and full passive range of motion without pain. Neck supple. No JVD present. No tracheal deviation, no edema and no erythema present. No thyromegaly present.  Cardiovascular: Normal rate, regular rhythm, normal heart sounds, intact distal pulses and normal pulses.  Exam reveals no gallop, no distant heart sounds and no friction rub.   No murmur heard. Pulmonary/Chest: Effort normal and breath sounds normal. No accessory muscle usage. No respiratory distress. She has no wheezes. She has no rales. She exhibits no tenderness.  Abdominal: Normal appearance and bowel sounds are normal. She exhibits no distension and no ascites. There is no tenderness.  Musculoskeletal: Normal range of motion. She exhibits no edema or tenderness.  Expected osteoarthritis, stiffness  Neurological: She is alert and oriented to person, place, and time. She has normal strength.  Skin: Skin is warm, dry and intact. No rash noted. She is not diaphoretic. No cyanosis or erythema. No pallor. Nails show no clubbing.  Psychiatric: She has a normal mood and affect. Her speech is normal. Judgment  and thought content normal. Cognition and memory are impaired. She is inattentive.  Nursing note and vitals reviewed.   Labs reviewed:  Recent Labs  06/20/16 0430  NA 140  K 3.2*  CL 102  CO2 30  GLUCOSE 71  BUN 20  CREATININE 0.71  CALCIUM 8.9  MG 2.2    Recent Labs  06/20/16 0430  AST 23  ALT 13*  ALKPHOS 47  BILITOT 0.5  PROT 6.5  ALBUMIN 3.6    Recent Labs  06/20/16 0430  WBC 6.2  NEUTROABS 3.5  HGB 13.3  HCT 39.9  MCV 90.4  PLT 262   Lab Results  Component Value Date   TSH 3.310 06/20/2016   No results found for: HGBA1C Lab Results  Component Value Date   CHOL 253 (H) 01/05/2015   HDL 60 01/05/2015   LDLCALC 165 (H) 01/05/2015   LDLDIRECT 110.8 02/15/2011   TRIG 142 01/05/2015   CHOLHDL 4.2 01/05/2015    Significant Diagnostic Results in last 30 days:  No results found.  Assessment/Plan 1. Late onset Alzheimer's disease without behavioral disturbance  Stable  Continue memantine 5  mg po BID  Continue lorazepam 0.5 mg po Q Day  Continue Lorazepam 0.5 mg po Q 4 hours prn anxiety, agitation  Continue Cerovite senior MVI PO Q Day  2. Wandering in diseases classified elsewhere  Stable  Safety precautions  Fall precautions  3. Allergic rhinitis, unspecified chronicity, unspecified seasonality, unspecified trigger  stable  4. Personal history of malignant neoplasm of breast  stable  5. Osteoarthritis, unspecified osteoarthritis type, unspecified site  Stable  Continue tramadol 50 mg po BID  6. Essential hypertension  Stable  Continue amlodipine 2.5 mg po Q Day  Continue Hydrochlorothiazide 25 mg po Q Day  Continue potassium chloride 20 meq BID  7. Major depressive disorder with single episode, remission status unspecified  Stable  Continue Sertraline 100 mg po Qday  Continue Trazodone 50 mg po Q HS  Family/ staff Communication:   Total Time:  Documentation:  Face to  Face:  Family/Phone:   Labs/tests ordered:  Cbc, met c B12, D, TSH, Mag+  Medication list reviewed and assessed for continued appropriateness. Monthly medication orders reviewed and signed.  Vikki Ports, NP-C Geriatrics [facility] [phone], [place] [address] Cell Phone (Mon-Fri 8am-5pm):  [phone] On Call:  [phone] & follow prompts after 5pm & weekends Office Phone:  [phone] Office Fax:  [phone]

## 2016-09-15 ENCOUNTER — Encounter
Admission: RE | Admit: 2016-09-15 | Discharge: 2016-09-15 | Disposition: A | Discharge status: Home / Self Care | Payer: Medicare Other | Source: Ambulatory Visit | Attending: Internal Medicine | Admitting: Internal Medicine

## 2016-09-15 ENCOUNTER — Encounter: Admission: RE | Admit: 2016-09-15 | Payer: Medicare Other | Source: Ambulatory Visit | Admitting: Internal Medicine

## 2016-10-14 ENCOUNTER — Other Ambulatory Visit: Payer: Self-pay

## 2016-10-14 MED ORDER — TRAMADOL HCL 50 MG PO TABS
50.0000 mg | ORAL_TABLET | Freq: Two times a day (BID) | ORAL | 5 refills | Status: DC
Start: 2016-10-14 — End: 2017-04-16

## 2016-10-14 NOTE — Telephone Encounter (Signed)
Rx sent to [facility] phone : [phone] , fax : [phone]  

## 2016-10-16 ENCOUNTER — Encounter
Admission: RE | Admit: 2016-10-16 | Discharge: 2016-10-16 | Disposition: A | Discharge status: Home / Self Care | Payer: Medicare Other | Source: Ambulatory Visit | Attending: Internal Medicine | Admitting: Internal Medicine

## 2016-10-22 ENCOUNTER — Encounter: Payer: Self-pay | Admitting: Gerontology

## 2016-10-22 ENCOUNTER — Non-Acute Institutional Stay: Payer: Medicare Other | Admitting: Gerontology

## 2016-10-22 DIAGNOSIS — G301 Alzheimer's disease with late onset: Secondary | ICD-10-CM | POA: Diagnosis not present

## 2016-10-22 DIAGNOSIS — Z9183 Wandering in diseases classified elsewhere: Secondary | ICD-10-CM

## 2016-10-22 DIAGNOSIS — Z853 Personal history of malignant neoplasm of breast: Secondary | ICD-10-CM

## 2016-10-22 DIAGNOSIS — F329 Major depressive disorder, single episode, unspecified: Secondary | ICD-10-CM | POA: Diagnosis not present

## 2016-10-22 DIAGNOSIS — I10 Essential (primary) hypertension: Secondary | ICD-10-CM

## 2016-10-22 DIAGNOSIS — F0280 Dementia in other diseases classified elsewhere without behavioral disturbance: Secondary | ICD-10-CM

## 2016-10-22 DIAGNOSIS — M1990 Unspecified osteoarthritis, unspecified site: Secondary | ICD-10-CM

## 2016-10-22 DIAGNOSIS — J309 Allergic rhinitis, unspecified: Secondary | ICD-10-CM | POA: Diagnosis not present

## 2016-10-22 NOTE — Progress Notes (Signed)
.  Location:   [place] Room Number: [phone] Place of Service:  SNF [phone]) Provider:  Toni Arthurs, NP-C  Toni Arthurs, NP  Patient Care Team: Toni Arthurs, NP as PCP - General (Family Medicine)  Extended Emergency Contact Information Primary Emergency Contact: [place] Phone: [phone] Relation: Friend Secondary Emergency Contact: SARFATI,ELIZABETH Address: [place] Phone: [phone] Relation: None  Code Status:  FULL Goals of care: Advanced Directive information Advanced Directives 10/22/2016  Does Patient Have a Medical Advance Directive? No     Chief Complaint  Patient presents with  . Medical Management of Chronic Issues    Routine Visit    HPI:  Pt is a 83 y.o. female seen today for medical management of chronic diseases. Pt has been stable over the past month. Pt has not had any reported falls this past month. Pt is incontinent of B&B. Needs assistance with ADLs. Likes to read, but also tears pages out of magazines. Enjoys caring for her baby-doll. Good appetite. Pt has limited attention span d/t Dementia. Limited ability to obtain complete ROS. Overall doing well. VSS. No other complaints.     Past Medical History:  Diagnosis Date  . Alzheimer's dementia with behavioral disturbance   . Breast cancer ([place])   . Hypertension    Past Surgical History:  Procedure Laterality Date  . ABDOMINAL HYSTERECTOMY     Still have ovaries  . BREAST BIOPSY Left 1985  . BREAST RECONSTRUCTION Left   . MASTECTOMY     Right  . TONSILLECTOMY  1942    Allergies  Allergen Reactions  . Sulfa Antibiotics     Allergies as of 10/22/2016      Reactions   Sulfa Antibiotics       Medication List       Accurate as of 10/22/16  9:38 AM. Always use your most recent med list.          amLODipine 2.5 MG tablet Commonly known as:  NORVASC Take 1 tablet by mouth daily.   CEROVITE SENIOR  Tabs Take 1 tablet by mouth daily.   Cholecalciferol 2000 units Caps Take 1 capsule by mouth daily.   hydrochlorothiazide 25 MG tablet Commonly known as:  HYDRODIURIL Take 25 mg by mouth daily.   LORazepam 0.5 MG tablet Commonly known as:  ATIVAN Take 0.5 mg by mouth daily. Around 2 pm when agitation starts   LORazepam 0.5 MG tablet Commonly known as:  ATIVAN Take 0.5 mg by mouth every 4 (four) hours as needed.   memantine 5 MG tablet Commonly known as:  NAMENDA Take 5 mg by mouth 2 (two) times daily.   potassium chloride SA 20 MEQ tablet Commonly known as:  K-DUR,KLOR-CON Take 20 mEq by mouth 2 (two) times daily.   sennosides-docusate sodium 8.6-50 MG tablet Commonly known as:  SENOKOT-S Take 1 tablet by mouth 2 (two) times daily.   sertraline 100 MG tablet Commonly known as:  ZOLOFT Take 100 mg by mouth daily.   traMADol 50 MG tablet Commonly known as:  ULTRAM Take 1 tablet (50 mg total) by mouth 2 (two) times daily.   traZODone 50 MG tablet Commonly known as:  DESYREL Take 50 mg by mouth at bedtime.       Review of Systems  Unable to perform ROS: Dementia  Constitutional: Negative for activity change, appetite change, chills, diaphoresis and fever.  HENT:  Negative for congestion, sneezing, sore throat, trouble swallowing and voice change.   Respiratory: Negative for apnea, cough, choking, chest tightness, shortness of breath and wheezing.   Cardiovascular: Negative for chest pain, palpitations and leg swelling.  Gastrointestinal: Negative for abdominal distention, abdominal pain, constipation, diarrhea and nausea.  Genitourinary: Negative for difficulty urinating, dysuria, frequency and urgency.  Musculoskeletal: Positive for arthralgias (typical arthritis). Negative for back pain, gait problem and myalgias.  Skin: Negative for color change, pallor, rash and wound.  Neurological: Negative for dizziness, tremors, syncope, speech difficulty, weakness, numbness  and headaches.  Psychiatric/Behavioral: Positive for agitation (at times). Negative for behavioral problems.  All other systems reviewed and are negative.   Immunization History  Administered Date(s) Administered  . Influenza Split 01/15/2011  . Influenza-Unspecified 12/16/2012, 12/27/2015  . PPD Test 04/12/2016  . Pneumococcal Conjugate-13 06/28/2014  . Pneumococcal Polysaccharide-23 05/05/2012  . Zoster 01/20/2006   Pertinent  Health Maintenance Due  Topic Date Due  . DEXA SCAN  06/07/1998  . INFLUENZA VACCINE  10/16/2016  . PNA vac Low Risk Adult  Completed   No flowsheet data found. Functional Status Survey:    Vitals:   10/22/16 0911  BP: 128/68  Pulse: 72  Resp: 16  Temp: (!) 96 F (35.6 C)  SpO2: 98%  Weight: 154 lb 12.8 oz (70.2 kg)   There is no height or weight on file to calculate BMI. Physical Exam  Constitutional: She is oriented to person, place, and time. Vital signs are normal. She appears well-developed and well-nourished. She is active and cooperative. She does not appear ill. No distress.  HENT:  Head: Normocephalic and atraumatic.  Mouth/Throat: Uvula is midline, oropharynx is clear and moist and mucous membranes are normal. Mucous membranes are not pale, not dry and not cyanotic.  Eyes: Pupils are equal, round, and reactive to light. Conjunctivae, EOM and lids are normal.  Neck: Trachea normal, normal range of motion and full passive range of motion without pain. Neck supple. No JVD present. No tracheal deviation, no edema and no erythema present. No thyromegaly present.  Cardiovascular: Normal rate, regular rhythm, normal heart sounds, intact distal pulses and normal pulses.  Exam reveals no gallop, no distant heart sounds and no friction rub.   No murmur heard. Pulmonary/Chest: Effort normal and breath sounds normal. No accessory muscle usage. No respiratory distress. She has no wheezes. She has no rales. She exhibits no tenderness.  Abdominal:  Normal appearance and bowel sounds are normal. She exhibits no distension and no ascites. There is no tenderness.  Musculoskeletal: Normal range of motion. She exhibits no edema or tenderness.  Expected osteoarthritis, stiffness  Neurological: She is alert and oriented to person, place, and time. She has normal strength.  Skin: Skin is warm, dry and intact. No rash noted. She is not diaphoretic. No cyanosis or erythema. No pallor. Nails show no clubbing.  Psychiatric: She has a normal mood and affect. Her speech is normal. Judgment and thought content normal. Cognition and memory are impaired. She is inattentive.  Nursing note and vitals reviewed.   Labs reviewed:  Recent Labs  06/20/16 0430  NA 140  K 3.2*  CL 102  CO2 30  GLUCOSE 71  BUN 20  CREATININE 0.71  CALCIUM 8.9  MG 2.2    Recent Labs  06/20/16 0430  AST 23  ALT 13*  ALKPHOS 47  BILITOT 0.5  PROT 6.5  ALBUMIN 3.6    Recent Labs  06/20/16 0430  WBC 6.2  NEUTROABS 3.5  HGB 13.3  HCT 39.9  MCV 90.4  PLT 262   Lab Results  Component Value Date   TSH 3.310 06/20/2016   No results found for: HGBA1C Lab Results  Component Value Date   CHOL 253 (H) 01/05/2015   HDL 60 01/05/2015   LDLCALC 165 (H) 01/05/2015   LDLDIRECT 110.8 02/15/2011   TRIG 142 01/05/2015   CHOLHDL 4.2 01/05/2015    Significant Diagnostic Results in last 30 days:  No results found.  Assessment/Plan 1. Late onset Alzheimer's disease without behavioral disturbance  Stable  Continue memantine 5 mg po BID  Continue lorazepam 0.5 mg po Q Day  Continue Lorazepam 0.5 mg po Q 4 hours prn anxiety, agitation  Continue Cerovite senior MVI PO Q Day  2. Wandering in diseases classified elsewhere  Stable  Safety precautions  Fall precautions  3. Allergic rhinitis, unspecified chronicity, unspecified seasonality, unspecified trigger  stable  4. Personal history of malignant neoplasm of breast  stable  5.  Osteoarthritis, unspecified osteoarthritis type, unspecified site  Stable  Continue tramadol 50 mg po BID  6. Essential hypertension  Stable  Continue amlodipine 2.5 mg po Q Day  Continue Hydrochlorothiazide 25 mg po Q Day  Continue potassium chloride 20 meq BID  7. Major depressive disorder with single episode, remission status unspecified  Stable  Continue Sertraline 100 mg po Qday  Continue Trazodone 50 mg po Q HS  Family/ staff Communication:   Total Time:  Documentation:  Face to Face:  Family/Phone:   Labs/tests ordered:    Medication list reviewed and assessed for continued appropriateness. Monthly medication orders reviewed and signed.  Vikki Ports, NP-C Geriatrics [facility] [phone], [place] [address] Cell Phone (Mon-Fri 8am-5pm):  [phone] On Call:  [phone] & follow prompts after 5pm & weekends Office Phone:  [phone] Office Fax:  [phone]

## 2016-11-16 ENCOUNTER — Encounter
Admission: RE | Admit: 2016-11-16 | Discharge: 2016-11-16 | Disposition: A | Discharge status: Home / Self Care | Payer: Medicare Other | Source: Ambulatory Visit | Attending: Internal Medicine | Admitting: Internal Medicine

## 2016-11-22 ENCOUNTER — Non-Acute Institutional Stay: Payer: Medicare Other | Admitting: Gerontology

## 2016-11-22 ENCOUNTER — Encounter: Payer: Self-pay | Admitting: Gerontology

## 2016-11-22 DIAGNOSIS — F0280 Dementia in other diseases classified elsewhere without behavioral disturbance: Secondary | ICD-10-CM | POA: Diagnosis not present

## 2016-11-22 DIAGNOSIS — Z853 Personal history of malignant neoplasm of breast: Secondary | ICD-10-CM

## 2016-11-22 DIAGNOSIS — M1990 Unspecified osteoarthritis, unspecified site: Secondary | ICD-10-CM

## 2016-11-22 DIAGNOSIS — Z9183 Wandering in diseases classified elsewhere: Secondary | ICD-10-CM

## 2016-11-22 DIAGNOSIS — G301 Alzheimer's disease with late onset: Secondary | ICD-10-CM | POA: Diagnosis not present

## 2016-11-22 DIAGNOSIS — F329 Major depressive disorder, single episode, unspecified: Secondary | ICD-10-CM

## 2016-11-22 DIAGNOSIS — I10 Essential (primary) hypertension: Secondary | ICD-10-CM

## 2016-11-22 DIAGNOSIS — J309 Allergic rhinitis, unspecified: Secondary | ICD-10-CM | POA: Diagnosis not present

## 2016-11-22 NOTE — Progress Notes (Signed)
.  Location:   [facility] Room Number: [place] of Service:  [phone]) Provider:  Toni Arthurs, NP-C  Toni Arthurs, NP  Patient Care Team: Toni Arthurs, NP as PCP - General (Family Medicine)  Extended Emergency Contact Information Primary Emergency Contact: [place] Phone: [phone] Relation: Friend Secondary Emergency Contact: SARFATI,ELIZABETH Address: [place] Phone: [phone] Relation: None  Code Status:  FULL Goals of care: Advanced Directive information Advanced Directives 11/22/2016  Does Patient Have a Medical Advance Directive? No     Chief Complaint  Patient presents with  . Medical Management of Chronic Issues    Routine Visit    HPI:  Pt is a 83 y.o. female seen today for medical management of chronic diseases. Pt has been stable over the past month. Pt has not had any reported falls this past month. Pt is incontinent of B&B. Needs assistance with ADLs. Likes to read, but also tears pages out of magazines. Enjoys caring for her baby-doll. Good appetite. Pt has limited attention span d/t Dementia. Limited ability to obtain complete ROS. Overall doing well. VSS. No other complaints.     Past Medical History:  Diagnosis Date  . Alzheimer's dementia with behavioral disturbance   . Breast cancer ([place])   . Hypertension    Past Surgical History:  Procedure Laterality Date  . ABDOMINAL HYSTERECTOMY     Still have ovaries  . BREAST BIOPSY Left 1985  . BREAST RECONSTRUCTION Left   . MASTECTOMY     Right  . TONSILLECTOMY  1942    Allergies  Allergen Reactions  . Sulfa Antibiotics     Allergies as of 11/22/2016      Reactions   Sulfa Antibiotics       Medication List       Accurate as of 11/22/16 12:12 PM. Always use your most recent med list.          amLODipine 2.5 MG tablet Commonly known as:  NORVASC Take 1 tablet by mouth daily.   CEROVITE SENIOR  Tabs Take 1 tablet by mouth daily.   Cholecalciferol 2000 units Caps Take 1 capsule by mouth daily.   hydrochlorothiazide 25 MG tablet Commonly known as:  HYDRODIURIL Take 25 mg by mouth daily.   LORazepam 0.5 MG tablet Commonly known as:  ATIVAN Take 0.5 mg by mouth daily. Around 2 pm when agitation starts   LORazepam 0.5 MG tablet Commonly known as:  ATIVAN Take 0.5 mg by mouth every 4 (four) hours as needed.   memantine 5 MG tablet Commonly known as:  NAMENDA Take 5 mg by mouth 2 (two) times daily.   potassium chloride SA 20 MEQ tablet Commonly known as:  K-DUR,KLOR-CON Take 20 mEq by mouth 2 (two) times daily.   sennosides-docusate sodium 8.6-50 MG tablet Commonly known as:  SENOKOT-S Take 2 tablets by mouth 2 (two) times daily.   sertraline 100 MG tablet Commonly known as:  ZOLOFT Take 100 mg by mouth daily.   traMADol 50 MG tablet Commonly known as:  ULTRAM Take 1 tablet (50 mg total) by mouth 2 (two) times daily.   traZODone 50 MG tablet Commonly known as:  DESYREL Take 50 mg by mouth at bedtime.       Review of Systems  Unable to perform ROS: Dementia  Constitutional: Negative for activity change, appetite change, chills, diaphoresis and fever.  HENT: Negative  for congestion, sneezing, sore throat, trouble swallowing and voice change.   Respiratory: Negative for apnea, cough, choking, chest tightness, shortness of breath and wheezing.   Cardiovascular: Negative for chest pain, palpitations and leg swelling.  Gastrointestinal: Negative for abdominal distention, abdominal pain, constipation, diarrhea and nausea.  Genitourinary: Negative for difficulty urinating, dysuria, frequency and urgency.  Musculoskeletal: Positive for arthralgias (typical arthritis). Negative for back pain, gait problem and myalgias.  Skin: Negative for color change, pallor, rash and wound.  Neurological: Negative for dizziness, tremors, syncope, speech difficulty, weakness, numbness  and headaches.  Psychiatric/Behavioral: Positive for agitation (at times). Negative for behavioral problems.  All other systems reviewed and are negative.   Immunization History  Administered Date(s) Administered  . Influenza Split 01/15/2011  . Influenza-Unspecified 12/16/2012, 12/27/2015  . PPD Test 04/12/2016  . Pneumococcal Conjugate-13 06/28/2014  . Pneumococcal Polysaccharide-23 05/05/2012  . Zoster 01/20/2006   Pertinent  Health Maintenance Due  Topic Date Due  . DEXA SCAN  06/07/1998  . INFLUENZA VACCINE  10/16/2016  . PNA vac Low Risk Adult  Completed   No flowsheet data found. Functional Status Survey:    Vitals:   11/22/16 1200  BP: (!) 165/86  Pulse: 71  Resp: 19  Temp: (!) 97 F (36.1 C)  SpO2: 93%  Weight: 154 lb 11.2 oz (70.2 kg)   There is no height or weight on file to calculate BMI. Physical Exam  Constitutional: She is oriented to person, place, and time. Vital signs are normal. She appears well-developed and well-nourished. She is active and cooperative. She does not appear ill. No distress.  HENT:  Head: Normocephalic and atraumatic.  Mouth/Throat: Uvula is midline, oropharynx is clear and moist and mucous membranes are normal. Mucous membranes are not pale, not dry and not cyanotic.  Eyes: Pupils are equal, round, and reactive to light. Conjunctivae, EOM and lids are normal.  Neck: Trachea normal, normal range of motion and full passive range of motion without pain. Neck supple. No JVD present. No tracheal deviation, no edema and no erythema present. No thyromegaly present.  Cardiovascular: Normal rate, regular rhythm, normal heart sounds, intact distal pulses and normal pulses.  Exam reveals no gallop, no distant heart sounds and no friction rub.   No murmur heard. Pulmonary/Chest: Effort normal and breath sounds normal. No accessory muscle usage. No respiratory distress. She has no wheezes. She has no rales. She exhibits no tenderness.  Abdominal:  Normal appearance and bowel sounds are normal. She exhibits no distension and no ascites. There is no tenderness.  Musculoskeletal: Normal range of motion. She exhibits no edema or tenderness.  Expected osteoarthritis, stiffness  Neurological: She is alert and oriented to person, place, and time. She has normal strength.  Skin: Skin is warm, dry and intact. No rash noted. She is not diaphoretic. No cyanosis or erythema. No pallor. Nails show no clubbing.  Psychiatric: She has a normal mood and affect. Her speech is normal. Judgment and thought content normal. Cognition and memory are impaired. She is inattentive.  Nursing note and vitals reviewed.   Labs reviewed:  Recent Labs  06/20/16 0430  NA 140  K 3.2*  CL 102  CO2 30  GLUCOSE 71  BUN 20  CREATININE 0.71  CALCIUM 8.9  MG 2.2    Recent Labs  06/20/16 0430  AST 23  ALT 13*  ALKPHOS 47  BILITOT 0.5  PROT 6.5  ALBUMIN 3.6    Recent Labs  06/20/16 0430  WBC 6.2  NEUTROABS 3.5  HGB 13.3  HCT 39.9  MCV 90.4  PLT 262   Lab Results  Component Value Date   TSH 3.310 06/20/2016   No results found for: HGBA1C Lab Results  Component Value Date   CHOL 253 (H) 01/05/2015   HDL 60 01/05/2015   LDLCALC 165 (H) 01/05/2015   LDLDIRECT 110.8 02/15/2011   TRIG 142 01/05/2015   CHOLHDL 4.2 01/05/2015    Significant Diagnostic Results in last 30 days:  No results found.  Assessment/Plan 1. Late onset Alzheimer's disease without behavioral disturbance  Stable  Continue memantine 5 mg po BID  Continue lorazepam 0.5 mg po Q Day  Continue Lorazepam 0.5 mg po Q 4 hours prn anxiety, agitation  Continue Cerovite senior MVI PO Q Day  2. Wandering in diseases classified elsewhere  Stable  Safety precautions  Fall precautions  3. Allergic rhinitis, unspecified chronicity, unspecified seasonality, unspecified trigger  stable  4. Personal history of malignant neoplasm of breast  stable  5.  Osteoarthritis, unspecified osteoarthritis type, unspecified site  Stable  Continue tramadol 50 mg po BID  6. Essential hypertension  Stable  Continue amlodipine 2.5 mg po Q Day  Continue Hydrochlorothiazide 25 mg po Q Day  Continue potassium chloride 20 meq BID  7. Major depressive disorder with single episode, remission status unspecified  Stable  Continue Sertraline 100 mg po Qday  Continue Trazodone 50 mg po Q HS  Family/ staff Communication:   Total Time:  Documentation:  Face to Face:  Family/Phone:   Labs/tests ordered:  Next month  Medication list reviewed and assessed for continued appropriateness. Monthly medication orders reviewed and signed.  Vikki Ports, NP-C Geriatrics [facility] [phone], [place] [address] Cell Phone (Mon-Fri 8am-5pm):  [phone] On Call:  [phone] & follow prompts after 5pm & weekends Office Phone:  [phone] Office Fax:  [phone]

## 2016-12-23 ENCOUNTER — Non-Acute Institutional Stay: Payer: Medicare Other | Admitting: Gerontology

## 2016-12-23 ENCOUNTER — Encounter: Payer: Self-pay | Admitting: Gerontology

## 2016-12-23 DIAGNOSIS — G301 Alzheimer's disease with late onset: Secondary | ICD-10-CM

## 2016-12-23 DIAGNOSIS — Z9183 Wandering in diseases classified elsewhere: Secondary | ICD-10-CM

## 2016-12-23 DIAGNOSIS — F0280 Dementia in other diseases classified elsewhere without behavioral disturbance: Secondary | ICD-10-CM

## 2016-12-23 DIAGNOSIS — M1990 Unspecified osteoarthritis, unspecified site: Secondary | ICD-10-CM | POA: Diagnosis not present

## 2016-12-23 DIAGNOSIS — I10 Essential (primary) hypertension: Secondary | ICD-10-CM | POA: Diagnosis not present

## 2016-12-23 DIAGNOSIS — Z853 Personal history of malignant neoplasm of breast: Secondary | ICD-10-CM | POA: Diagnosis not present

## 2016-12-23 DIAGNOSIS — J309 Allergic rhinitis, unspecified: Secondary | ICD-10-CM

## 2016-12-23 NOTE — Progress Notes (Signed)
.  Location:   [facility] Room Number: [place] of Service:  [id number] [phone]) Provider:  Toni Arthurs, NP-C  Toni Arthurs, NP  Patient Care Team: Toni Arthurs, NP as PCP - General (Family Medicine)  Extended Emergency Contact Information Primary Emergency Contact: [place] of [place] Phone: [phone] Relation: Friend Secondary Emergency Contact: SARFATI,ELIZABETH Address: [place] Phone: [phone] Relation: None  Code Status:  FULL Goals of care: Advanced Directive information Advanced Directives 12/23/2016  Does Patient Have a Medical Advance Directive? No     Chief Complaint  Patient presents with  . Medical Management of Chronic Issues    Routine Visit    HPI:  Pt is a 83 y.o. female seen today for medical management of chronic diseases.  Patient has been stable over the past month.  Patient has not had any reported falls this past month.  Patient is incontinent of bowel and bladder.  Needs assistance with ADLs.  Likes to read, but also tears pages at magazines.  Enjoys caring for her baby doll.  Good appetite.  Patient has limited attention span due to dementia.  Limited ability to obtain complete ROS.  Overall doing well.  Vital signs stable.  No other complaints.   Past Medical History:  Diagnosis Date  . Alzheimer's dementia with behavioral disturbance   . Breast cancer ([place])   . Hypertension    Past Surgical History:  Procedure Laterality Date  . ABDOMINAL HYSTERECTOMY     Still have ovaries  . BREAST BIOPSY Left 1985  . BREAST RECONSTRUCTION Left   . MASTECTOMY     Right  . TONSILLECTOMY  1942    Allergies  Allergen Reactions  . Sulfa Antibiotics     Allergies as of 12/23/2016      Reactions   Sulfa Antibiotics       Medication List       Accurate as of 12/23/16  9:36 AM. Always use your most recent med list.          amLODipine 2.5 MG tablet Commonly known as:   NORVASC Take 1 tablet by mouth daily.   CEROVITE SENIOR Tabs Take 1 tablet by mouth daily.   Cholecalciferol 2000 units Caps Take 1 capsule by mouth daily.   hydrochlorothiazide 25 MG tablet Commonly known as:  HYDRODIURIL Take 25 mg by mouth daily.   LORazepam 0.5 MG tablet Commonly known as:  ATIVAN Take 0.5 mg by mouth daily. Around 2 pm when agitation starts   LORazepam 0.5 MG tablet Commonly known as:  ATIVAN Take 0.5 mg by mouth every 4 (four) hours as needed.   memantine 5 MG tablet Commonly known as:  NAMENDA Take 5 mg by mouth 2 (two) times daily.   potassium chloride SA 20 MEQ tablet Commonly known as:  K-DUR,KLOR-CON Take 20 mEq by mouth 2 (two) times daily.   sennosides-docusate sodium 8.6-50 MG tablet Commonly known as:  SENOKOT-S Take 2 tablets by mouth 2 (two) times daily.   sertraline 100 MG tablet Commonly known as:  ZOLOFT Take 100 mg by mouth daily.   traMADol 50 MG tablet Commonly known as:  ULTRAM Take 1 tablet (50 mg total) by mouth 2 (two) times daily.   traZODone 50 MG tablet Commonly known as:  DESYREL Take 50 mg by mouth at bedtime.       Review of Systems  Unable to perform ROS:  Dementia  Constitutional: Negative for activity change, appetite change, chills, diaphoresis and fever.  HENT: Negative for congestion, sneezing, sore throat, trouble swallowing and voice change.   Respiratory: Negative for apnea, cough, choking, chest tightness, shortness of breath and wheezing.   Cardiovascular: Negative for chest pain, palpitations and leg swelling.  Gastrointestinal: Negative for abdominal distention, abdominal pain, constipation, diarrhea and nausea.  Genitourinary: Negative for difficulty urinating, dysuria, frequency and urgency.  Musculoskeletal: Positive for arthralgias (typical arthritis). Negative for back pain, gait problem and myalgias.  Skin: Negative for color change, pallor, rash and wound.  Neurological: Negative for  dizziness, tremors, syncope, speech difficulty, weakness, numbness and headaches.  Psychiatric/Behavioral: Positive for agitation (at times). Negative for behavioral problems.  All other systems reviewed and are negative.   Immunization History  Administered Date(s) Administered  . Influenza Split 01/15/2011  . Influenza-Unspecified 12/16/2012, 12/27/2015  . PPD Test 04/12/2016  . Pneumococcal Conjugate-13 06/28/2014  . Pneumococcal Polysaccharide-23 05/05/2012  . Zoster 01/20/2006   Pertinent  Health Maintenance Due  Topic Date Due  . INFLUENZA VACCINE  10/16/2016  . DEXA SCAN  Completed  . PNA vac Low Risk Adult  Completed   No flowsheet data found. Functional Status Survey:    Vitals:   12/23/16 0925  BP: 140/73  Pulse: 82  Resp: 18  Temp: 98.9 F (37.2 C)  SpO2: 98%  Weight: 155 lb 12.8 oz (70.7 kg)   There is no height or weight on file to calculate BMI. Physical Exam  Constitutional: She is oriented to person, place, and time. Vital signs are normal. She appears well-developed and well-nourished. She is active and cooperative. She does not appear ill. No distress.  HENT:  Head: Normocephalic and atraumatic.  Mouth/Throat: Uvula is midline, oropharynx is clear and moist and mucous membranes are normal. Mucous membranes are not pale, not dry and not cyanotic.  Eyes: Pupils are equal, round, and reactive to light. Conjunctivae, EOM and lids are normal.  Neck: Trachea normal, normal range of motion and full passive range of motion without pain. Neck supple. No JVD present. No tracheal deviation, no edema and no erythema present. No thyromegaly present.  Cardiovascular: Normal rate, regular rhythm, normal heart sounds, intact distal pulses and normal pulses.  Exam reveals no gallop, no distant heart sounds and no friction rub.   No murmur heard. Pulses:      Dorsalis pedis pulses are 2+ on the right side, and 2+ on the left side.  No edema  Pulmonary/Chest: Effort  normal and breath sounds normal. No accessory muscle usage. No respiratory distress. She has no decreased breath sounds. She has no wheezes. She has no rhonchi. She has no rales. She exhibits no tenderness.  Abdominal: Soft. Normal appearance and bowel sounds are normal. She exhibits no distension and no ascites. There is no tenderness.  Musculoskeletal: Normal range of motion. She exhibits no edema or tenderness.  Expected osteoarthritis, stiffness  Neurological: She is alert and oriented to person, place, and time. She has normal strength.  Skin: Skin is warm, dry and intact. No rash noted. She is not diaphoretic. No cyanosis or erythema. No pallor. Nails show no clubbing.  Psychiatric: She has a normal mood and affect. Her speech is normal. Judgment and thought content normal. Cognition and memory are impaired. She is inattentive.  Nursing note and vitals reviewed.   Labs reviewed:  Recent Labs  06/20/16 0430  NA 140  K 3.2*  CL 102  CO2 30  GLUCOSE  71  BUN 20  CREATININE 0.71  CALCIUM 8.9  MG 2.2    Recent Labs  06/20/16 0430  AST 23  ALT 13*  ALKPHOS 47  BILITOT 0.5  PROT 6.5  ALBUMIN 3.6    Recent Labs  06/20/16 0430  WBC 6.2  NEUTROABS 3.5  HGB 13.3  HCT 39.9  MCV 90.4  PLT 262   Lab Results  Component Value Date   TSH 3.310 06/20/2016   No results found for: HGBA1C Lab Results  Component Value Date   CHOL 253 (H) 01/05/2015   HDL 60 01/05/2015   LDLCALC 165 (H) 01/05/2015   LDLDIRECT 110.8 02/15/2011   TRIG 142 01/05/2015   CHOLHDL 4.2 01/05/2015    Significant Diagnostic Results in last 30 days:  No results found.  Assessment/Plan 1. Late onset Alzheimer's disease without behavioral disturbance  Stable  Continue memantine 5 mg po BID  Continue lorazepam 0.5 mg po Q Day  Continue Lorazepam 0.5 mg po Q 4 hours prn anxiety, agitation  Continue Cerovite senior MVI PO Q Day  2. Wandering in diseases classified  elsewhere  Stable  Safety precautions  Fall precautions  3. Allergic rhinitis, unspecified chronicity, unspecified seasonality, unspecified trigger  stable  4. Personal history of malignant neoplasm of breast  stable  5. Osteoarthritis, unspecified osteoarthritis type, unspecified site  Stable  Continue tramadol 50 mg po BID  6. Essential hypertension  Stable  Continue amlodipine 2.5 mg po Q Day  Continue Hydrochlorothiazide 25 mg po Q Day  Continue potassium chloride 20 meq BID  7. Major depressive disorder with single episode, remission status unspecified  Stable  Continue Sertraline 100 mg po Qday  Continue Trazodone 50 mg po Q HS  Continue all medications as listed above unless otherwise indicated  Family/ staff Communication:   Total Time:  Documentation:  Face to Face:  Family/Phone:   Labs/tests ordered: CBC, met C, TSH, B12, D, mag  Medication list reviewed and assessed for continued appropriateness. Monthly medication orders reviewed and signed.  Vikki Ports, NP-C Geriatrics [facility] [phone], [place] [address] Cell Phone (Mon-Fri 8am-5pm):  [phone] On Call:  [phone] & follow prompts after 5pm & weekends Office Phone:  [phone] Office Fax:  [phone]

## 2017-01-16 ENCOUNTER — Other Ambulatory Visit
Admission: RE | Admit: 2017-01-16 | Discharge: 2017-01-16 | Disposition: A | Discharge status: Home / Self Care | Payer: Medicare Other | Source: Ambulatory Visit | Attending: Gerontology | Admitting: Gerontology

## 2017-01-16 ENCOUNTER — Encounter
Admission: RE | Admit: 2017-01-16 | Discharge: 2017-01-16 | Disposition: A | Discharge status: Home / Self Care | Payer: Medicare Other | Source: Ambulatory Visit | Attending: Internal Medicine | Admitting: Internal Medicine

## 2017-01-16 DIAGNOSIS — I10 Essential (primary) hypertension: Secondary | ICD-10-CM | POA: Diagnosis not present

## 2017-01-16 LAB — CBC WITH DIFFERENTIAL/PLATELET
BASOS ABS: 0.1 10*3/uL (ref 0–0.1)
BASOS PCT: 1 %
Eosinophils Absolute: 0.2 10*3/uL (ref 0–0.7)
Eosinophils Relative: 4 %
HEMATOCRIT: 38.5 % (ref 35.0–47.0)
HEMOGLOBIN: 13.1 g/dL (ref 12.0–16.0)
LYMPHS PCT: 25 %
Lymphs Abs: 1.4 10*3/uL (ref 1.0–3.6)
MCH: 30.4 pg (ref 26.0–34.0)
MCHC: 33.9 g/dL (ref 32.0–36.0)
MCV: 89.7 fL (ref 80.0–100.0)
Monocytes Absolute: 0.5 10*3/uL (ref 0.2–0.9)
Monocytes Relative: 9 %
NEUTROS ABS: 3.5 10*3/uL (ref 1.4–6.5)
Neutrophils Relative %: 61 %
Platelets: 261 10*3/uL (ref 150–440)
RBC: 4.29 MIL/uL (ref 3.80–5.20)
RDW: 14.1 % (ref 11.5–14.5)
WBC: 5.7 10*3/uL (ref 3.6–11.0)

## 2017-01-16 LAB — COMPREHENSIVE METABOLIC PANEL
ALBUMIN: 3.4 g/dL — AB (ref 3.5–5.0)
ALK PHOS: 45 U/L (ref 38–126)
ALT: 13 U/L — AB (ref 14–54)
ANION GAP: 5 (ref 5–15)
AST: 22 U/L (ref 15–41)
BUN: 15 mg/dL (ref 6–20)
CALCIUM: 8.9 mg/dL (ref 8.9–10.3)
CO2: 30 mmol/L (ref 22–32)
Chloride: 103 mmol/L (ref 101–111)
Creatinine, Ser: 0.71 mg/dL (ref 0.44–1.00)
GFR calc Af Amer: >60 mL/min (ref >60)
GFR calc non Af Amer: >60 mL/min (ref >60)
GLUCOSE: 80 mg/dL (ref 65–99)
Potassium: 3.4 mmol/L — ABNORMAL LOW (ref 3.5–5.1)
SODIUM: 138 mmol/L (ref 135–145)
Total Bilirubin: 0.6 mg/dL (ref 0.3–1.2)
Total Protein: 6.1 g/dL — ABNORMAL LOW (ref 6.5–8.1)

## 2017-01-16 LAB — TSH: TSH: 2.95 u[IU]/mL (ref 0.350–4.500)

## 2017-01-16 LAB — MAGNESIUM: Magnesium: 2.1 mg/dL (ref 1.7–2.4)

## 2017-01-17 LAB — VITAMIN D 25 HYDROXY (VIT D DEFICIENCY, FRACTURES): VIT D 25 HYDROXY: 38.7 ng/mL (ref 30.0–100.0)

## 2017-01-22 ENCOUNTER — Non-Acute Institutional Stay: Payer: Medicare Other | Admitting: Gerontology

## 2017-01-22 ENCOUNTER — Encounter: Payer: Self-pay | Admitting: Gerontology

## 2017-01-22 DIAGNOSIS — G301 Alzheimer's disease with late onset: Secondary | ICD-10-CM

## 2017-01-22 DIAGNOSIS — F0280 Dementia in other diseases classified elsewhere without behavioral disturbance: Secondary | ICD-10-CM | POA: Diagnosis not present

## 2017-01-22 DIAGNOSIS — Z9183 Wandering in diseases classified elsewhere: Secondary | ICD-10-CM | POA: Diagnosis not present

## 2017-01-22 DIAGNOSIS — M1990 Unspecified osteoarthritis, unspecified site: Secondary | ICD-10-CM

## 2017-01-22 DIAGNOSIS — J309 Allergic rhinitis, unspecified: Secondary | ICD-10-CM | POA: Diagnosis not present

## 2017-01-22 DIAGNOSIS — Z853 Personal history of malignant neoplasm of breast: Secondary | ICD-10-CM | POA: Diagnosis not present

## 2017-01-22 DIAGNOSIS — I10 Essential (primary) hypertension: Secondary | ICD-10-CM

## 2017-01-22 DIAGNOSIS — F329 Major depressive disorder, single episode, unspecified: Secondary | ICD-10-CM

## 2017-01-22 LAB — VITAMIN B12

## 2017-01-27 ENCOUNTER — Other Ambulatory Visit: Payer: Self-pay

## 2017-01-27 MED ORDER — LORAZEPAM 0.5 MG PO TABS: 0.5000 mg | ORAL_TABLET | ORAL | 1 refills | Status: DC | PRN

## 2017-01-27 MED ORDER — LORAZEPAM 0.5 MG PO TABS: 0.5000 mg | ORAL_TABLET | Freq: Every day | ORAL | 1 refills | Status: DC

## 2017-01-27 NOTE — Telephone Encounter (Signed)
Rx sent to [facility] phone : [phone] , fax : [phone]  

## 2017-02-10 DIAGNOSIS — M79675 Pain in left toe(s): Secondary | ICD-10-CM | POA: Diagnosis not present

## 2017-02-10 DIAGNOSIS — M79674 Pain in right toe(s): Secondary | ICD-10-CM | POA: Diagnosis not present

## 2017-02-10 DIAGNOSIS — B351 Tinea unguium: Secondary | ICD-10-CM | POA: Diagnosis not present

## 2017-02-11 NOTE — Progress Notes (Signed)
.  Location:   [facility] Room Number: [phone] Place of Service:  ALF [phone]) Provider:  Toni Arthurs, NP-C  Toni Arthurs, NP  Patient Care Team: Toni Arthurs, NP as PCP - General (Family Medicine)  Extended Emergency Contact Information Primary Emergency Contact: [place] Phone: [phone] Relation: Friend Secondary Emergency Contact: SARFATI,ELIZABETH Address: [place] Phone: [phone] Relation: None  Code Status:  FULL Goals of care: Advanced Directive information Advanced Directives 01/22/2017  Does Patient Have a Medical Advance Directive? No     Chief Complaint  Patient presents with  . Medical Management of Chronic Issues    Routine Visit    HPI:  Pt is a 83 y.o. female seen today for medical management of chronic diseases.  Patient has been stable over the past month.  Patient has not had any reported falls this past month.  Patient is incontinent of bowel and bladder.  Needs assistance with ADLs.  Likes to read, but also tears pages out of magazines.  Enjoys caring for her baby doll.  Good appetite.  Patient has limited attention span due to dementia.  Limited ability to complete ROS.  Overall doing well.  Routine labs obtained this month.  Mild hypokalemia.  One-time dose of supplement given.  Vital signs stable.  No other complaints.   Past Medical History:  Diagnosis Date  . Alzheimer's dementia with behavioral disturbance   . Breast cancer ([place])   . Hypertension    Past Surgical History:  Procedure Laterality Date  . ABDOMINAL HYSTERECTOMY     Still have ovaries  . BREAST BIOPSY Left 1985  . BREAST RECONSTRUCTION Left   . MASTECTOMY     Right  . TONSILLECTOMY  1942    Allergies  Allergen Reactions  . Sulfa Antibiotics     Allergies as of 01/22/2017      Reactions   Sulfa Antibiotics       Medication List        Accurate as of 01/22/17 11:59 PM. Always  use your most recent med list.          amLODipine 2.5 MG tablet Commonly known as:  NORVASC Take 1 tablet by mouth daily.   CEROVITE SENIOR Tabs Take 1 tablet by mouth daily.   Cholecalciferol 2000 units Caps Take 1 capsule by mouth daily.   hydrochlorothiazide 25 MG tablet Commonly known as:  HYDRODIURIL Take 25 mg by mouth daily.   LORazepam 0.5 MG tablet Commonly known as:  ATIVAN Take 0.5 mg by mouth daily. Around 2 pm when agitation starts   LORazepam 0.5 MG tablet Commonly known as:  ATIVAN Take 0.5 mg by mouth every 4 (four) hours as needed.   memantine 5 MG tablet Commonly known as:  NAMENDA Take 5 mg by mouth 2 (two) times daily.   potassium chloride SA 20 MEQ tablet Commonly known as:  K-DUR,KLOR-CON Take 20 mEq by mouth 2 (two) times daily.   sennosides-docusate sodium 8.6-50 MG tablet Commonly known as:  SENOKOT-S Take 2 tablets by mouth 2 (two) times daily.   sertraline 100 MG tablet Commonly known as:  ZOLOFT Take 100 mg by mouth daily.   traMADol 50 MG tablet Commonly known as:  ULTRAM Take 1 tablet (50 mg total) by mouth 2 (two) times daily.   traZODone 50 MG tablet Commonly known as:  DESYREL Take 50 mg by mouth at  bedtime.       Review of Systems  Unable to perform ROS: Dementia  Constitutional: Negative for activity change, appetite change, chills, diaphoresis and fever.  HENT: Negative for congestion, sneezing, sore throat, trouble swallowing and voice change.   Respiratory: Negative for apnea, cough, choking, chest tightness, shortness of breath and wheezing.   Cardiovascular: Negative for chest pain, palpitations and leg swelling.  Gastrointestinal: Negative for abdominal distention, abdominal pain, constipation, diarrhea and nausea.  Genitourinary: Negative for difficulty urinating, dysuria, frequency and urgency.  Musculoskeletal: Positive for arthralgias (typical arthritis). Negative for back pain, gait problem and myalgias.    Skin: Negative for color change, pallor, rash and wound.  Neurological: Negative for dizziness, tremors, syncope, speech difficulty, weakness, numbness and headaches.  Psychiatric/Behavioral: Positive for agitation (at times) and confusion. Negative for behavioral problems.  All other systems reviewed and are negative.   Immunization History  Administered Date(s) Administered  . Influenza Split 01/15/2011  . Influenza-Unspecified 12/16/2012, 12/27/2015, 12/10/2016  . PPD Test 04/12/2016  . Pneumococcal Conjugate-13 06/28/2014  . Pneumococcal Polysaccharide-23 05/05/2012  . Zoster 01/20/2006   Pertinent  Health Maintenance Due  Topic Date Due  . INFLUENZA VACCINE  Completed  . DEXA SCAN  Completed  . PNA vac Low Risk Adult  Completed   No flowsheet data found. Functional Status Survey:    Vitals:   01/22/17 1435  BP: (!) 143/79  Pulse: 79  Resp: 16  Temp: (!) 96 F (35.6 C)  TempSrc: Oral  SpO2: 99%  Weight: 151 lb 11.2 oz (68.8 kg)   There is no height or weight on file to calculate BMI. Physical Exam  Constitutional: She is oriented to person, place, and time. Vital signs are normal. She appears well-developed and well-nourished. She is active and cooperative. She does not appear ill. No distress.  HENT:  Head: Normocephalic and atraumatic.  Mouth/Throat: Uvula is midline, oropharynx is clear and moist and mucous membranes are normal. Mucous membranes are not pale, not dry and not cyanotic.  Eyes: Conjunctivae, EOM and lids are normal. Pupils are equal, round, and reactive to light.  Neck: Trachea normal, normal range of motion and full passive range of motion without pain. Neck supple. No JVD present. No tracheal deviation, no edema and no erythema present. No thyromegaly present.  Cardiovascular: Normal rate, regular rhythm, normal heart sounds, intact distal pulses and normal pulses. Exam reveals no gallop, no distant heart sounds and no friction rub.  No murmur  heard. Pulses:      Dorsalis pedis pulses are 2+ on the right side, and 2+ on the left side.  No edema  Pulmonary/Chest: Effort normal and breath sounds normal. No accessory muscle usage. No respiratory distress. She has no decreased breath sounds. She has no wheezes. She has no rhonchi. She has no rales. She exhibits no tenderness.  Abdominal: Soft. Normal appearance and bowel sounds are normal. She exhibits no distension and no ascites. There is no tenderness.  Musculoskeletal: Normal range of motion. She exhibits no edema or tenderness.  Expected osteoarthritis, stiffness  Neurological: She is alert and oriented to person, place, and time. She has normal strength.  Skin: Skin is warm, dry and intact. No rash noted. She is not diaphoretic. No cyanosis or erythema. No pallor. Nails show no clubbing.  Psychiatric: She has a normal mood and affect. Her speech is normal. Judgment and thought content normal. Cognition and memory are impaired. She exhibits abnormal recent memory and abnormal remote memory. She is  inattentive.  Nursing note and vitals reviewed.   Labs reviewed: Recent Labs    06/20/16 0430 01/16/17 0450  NA 140 138  K 3.2* 3.4*  CL 102 103  CO2 30 30  GLUCOSE 71 80  BUN 20 15  CREATININE 0.71 0.71  CALCIUM 8.9 8.9  MG 2.2 2.1   Recent Labs    06/20/16 0430 01/16/17 0450  AST 23 22  ALT 13* 13*  ALKPHOS 47 45  BILITOT 0.5 0.6  PROT 6.5 6.1*  ALBUMIN 3.6 3.4*   Recent Labs    06/20/16 0430 01/16/17 0450  WBC 6.2 5.7  NEUTROABS 3.5 3.5  HGB 13.3 13.1  HCT 39.9 38.5  MCV 90.4 89.7  PLT 262 261   Lab Results  Component Value Date   TSH 2.950 01/16/2017   No results found for: HGBA1C Lab Results  Component Value Date   CHOL 253 (H) 01/05/2015   HDL 60 01/05/2015   LDLCALC 165 (H) 01/05/2015   LDLDIRECT 110.8 02/15/2011   TRIG 142 01/05/2015   CHOLHDL 4.2 01/05/2015    Significant Diagnostic Results in last 30 days:  No results  found.  Assessment/Plan 1. Late onset Alzheimer's disease without behavioral disturbance  Stable  Continue memantine 5 mg po BID  Continue lorazepam 0.5 mg po Q Day  Continue Lorazepam 0.5 mg po Q 4 hours prn anxiety, agitation  Continue Cerovite senior MVI PO Q Day  2. Wandering in diseases classified elsewhere  Stable  Safety precautions  Fall precautions  3. Allergic rhinitis, unspecified chronicity, unspecified seasonality, unspecified trigger  stable  4. Personal history of malignant neoplasm of breast  stable  5. Osteoarthritis, unspecified osteoarthritis type, unspecified site  Stable  Continue tramadol 50 mg po BID  6. Essential hypertension  Stable  Continue amlodipine 2.5 mg po Q Day  Continue Hydrochlorothiazide 25 mg po Q Day  Continue potassium chloride 20 meq BID  7. Major depressive disorder with single episode, remission status unspecified  Stable  Continue Sertraline 100 mg po Qday  Continue Trazodone 50 mg po Q HS  Continue all medications as listed above  Family/ staff Communication:   Total Time:  Documentation:  Face to Face:  Family/Phone:   Labs/tests ordered: Not due  Medication list reviewed and assessed for continued appropriateness. Monthly medication orders reviewed and signed.  Vikki Ports, NP-C Geriatrics [facility] [phone], [place] [address] Cell Phone (Mon-Fri 8am-5pm):  [phone] On Call:  [phone] & follow prompts after 5pm & weekends Office Phone:  [phone] Office Fax:  [phone]

## 2017-02-15 ENCOUNTER — Encounter
Admission: RE | Admit: 2017-02-15 | Discharge: 2017-02-15 | Disposition: A | Discharge status: Home / Self Care | Payer: Medicare Other | Source: Ambulatory Visit | Attending: Internal Medicine | Admitting: Internal Medicine

## 2017-02-24 DIAGNOSIS — G301 Alzheimer's disease with late onset: Secondary | ICD-10-CM | POA: Diagnosis not present

## 2017-02-24 DIAGNOSIS — F0281 Dementia in other diseases classified elsewhere with behavioral disturbance: Secondary | ICD-10-CM | POA: Diagnosis not present

## 2017-02-24 DIAGNOSIS — Z0000 Encounter for general adult medical examination without abnormal findings: Secondary | ICD-10-CM | POA: Diagnosis not present

## 2017-02-24 DIAGNOSIS — Z853 Personal history of malignant neoplasm of breast: Secondary | ICD-10-CM | POA: Diagnosis not present

## 2017-02-24 DIAGNOSIS — I10 Essential (primary) hypertension: Secondary | ICD-10-CM | POA: Diagnosis not present

## 2017-02-24 DIAGNOSIS — F325 Major depressive disorder, single episode, in full remission: Secondary | ICD-10-CM | POA: Diagnosis not present

## 2017-02-28 ENCOUNTER — Encounter: Payer: Self-pay | Admitting: Gerontology

## 2017-02-28 ENCOUNTER — Non-Acute Institutional Stay: Payer: Medicare Other | Admitting: Gerontology

## 2017-02-28 DIAGNOSIS — I10 Essential (primary) hypertension: Secondary | ICD-10-CM | POA: Diagnosis not present

## 2017-02-28 DIAGNOSIS — F0280 Dementia in other diseases classified elsewhere without behavioral disturbance: Secondary | ICD-10-CM | POA: Diagnosis not present

## 2017-02-28 DIAGNOSIS — G301 Alzheimer's disease with late onset: Secondary | ICD-10-CM

## 2017-02-28 DIAGNOSIS — J309 Allergic rhinitis, unspecified: Secondary | ICD-10-CM | POA: Diagnosis not present

## 2017-03-18 ENCOUNTER — Encounter
Admission: RE | Admit: 2017-03-18 | Discharge: 2017-03-18 | Disposition: A | Discharge status: Home / Self Care | Payer: Medicare Other | Source: Ambulatory Visit | Attending: Internal Medicine | Admitting: Internal Medicine

## 2017-04-03 ENCOUNTER — Non-Acute Institutional Stay: Payer: Medicare Other | Admitting: Gerontology

## 2017-04-03 ENCOUNTER — Encounter: Payer: Self-pay | Admitting: Gerontology

## 2017-04-03 DIAGNOSIS — Z853 Personal history of malignant neoplasm of breast: Secondary | ICD-10-CM | POA: Diagnosis not present

## 2017-04-03 DIAGNOSIS — M1990 Unspecified osteoarthritis, unspecified site: Secondary | ICD-10-CM

## 2017-04-03 DIAGNOSIS — Z9183 Wandering in diseases classified elsewhere: Secondary | ICD-10-CM

## 2017-04-05 NOTE — Progress Notes (Signed)
Location[facility] Room Number: [id number] Place of Service:  ALF [phone]) Provider:  Toni Arthurs, NP-C  Emily Arthurs, NP  Patient Care Team: Emily Arthurs, NP as PCP - General (Family Medicine)  Extended Emergency Contact Information Primary Emergency Contact: Emily Summers [place] Phone: [phone] Relation: Friend Secondary Emergency Contact: Emily Summers Address: [place] Phone: [phone] Relation: None  Code Status:  FULL Goals of care: Advanced Directive information Advanced Directives 04/03/2017  Does Patient Have a Medical Advance Directive? No     Chief Complaint  Patient presents with  . Medical Management of Chronic Issues    Routine Visit    HPI:  Pt is a 84 y.o. female seen today for medical management of chronic diseases.    BP (high blood pressure) Stable. No episodes of hypotension. Symptoms controlled with Norvasc and HCTZ with potassium chloride  Allergic rhinitis Stable. No current scheduled treatment  Alzheimer's dementia Progressive. Pt is resident on [place] unit. Treatment with Namenda. Pt spends a lot of time alone holding her babydoll or tearing magazines.   Please note pt with limited verbal ability. Unable to obtain complete ROS. Some ROS info obtained from staff and documentation.   Past Medical History:  Diagnosis Date  . Alzheimer's dementia with behavioral disturbance   . Breast cancer ([place])   . Hypertension    Past Surgical History:  Procedure Laterality Date  . ABDOMINAL HYSTERECTOMY     Still have ovaries  . BREAST BIOPSY Left 1985  . BREAST RECONSTRUCTION Left   . MASTECTOMY     Right  . TONSILLECTOMY  1942    Allergies  Allergen Reactions  . Sulfa Antibiotics     Allergies as of 02/28/2017      Reactions   Sulfa Antibiotics       Medication List        Accurate as of 02/28/17 11:59 PM. Always use your most recent med list.            amLODipine 2.5 MG tablet Commonly known as:  NORVASC Take 1 tablet by mouth daily.   CEROVITE SENIOR Tabs Take 1 tablet by mouth daily.   Cholecalciferol 2000 units Caps Take 1 capsule by mouth daily.   hydrochlorothiazide 25 MG tablet Commonly known as:  HYDRODIURIL Take 25 mg by mouth daily.   LORazepam 0.5 MG tablet Commonly known as:  ATIVAN Take 1 tablet (0.5 mg total) daily by mouth. Around 2 pm when agitation starts   memantine 5 MG tablet Commonly known as:  NAMENDA Take 5 mg by mouth 2 (two) times daily.   potassium chloride SA 20 MEQ tablet Commonly known as:  K-DUR,KLOR-CON Take 20 mEq by mouth 2 (two) times daily.   sennosides-docusate sodium 8.6-50 MG tablet Commonly known as:  SENOKOT-S Take 2 tablets by mouth 2 (two) times daily.   sertraline 100 MG tablet Commonly known as:  ZOLOFT Take 100 mg by mouth daily.   traMADol 50 MG tablet Commonly known as:  ULTRAM Take 1 tablet (50 mg total) by mouth 2 (two) times daily.   traZODone 50 MG tablet Commonly known as:  DESYREL Take 50 mg by mouth at bedtime.       Review of Systems  Unable to perform ROS: Dementia  Constitutional: Negative for activity change, appetite change, chills, diaphoresis and fever.  HENT: Negative for congestion, mouth sores, nosebleeds, postnasal drip, sneezing, sore  throat, trouble swallowing and voice change.   Respiratory: Negative for apnea, cough, choking, chest tightness, shortness of breath and wheezing.   Cardiovascular: Negative for chest pain, palpitations and leg swelling.  Gastrointestinal: Negative for abdominal distention, abdominal pain, constipation, diarrhea and nausea.  Genitourinary: Negative for difficulty urinating, dysuria, frequency and urgency.  Musculoskeletal: Negative for back pain, gait problem and myalgias. Arthralgias: typical arthritis.  Skin: Negative for color change, pallor, rash and wound.  Neurological: Negative for dizziness, tremors,  syncope, speech difficulty, weakness, numbness and headaches.  Psychiatric/Behavioral: Positive for confusion. Negative for agitation and behavioral problems.  All other systems reviewed and are negative.   Immunization History  Administered Date(s) Administered  . Influenza Split 01/15/2011  . Influenza-Unspecified 12/16/2012, 12/27/2015, 12/10/2016  . PPD Test 04/12/2016  . Pneumococcal Conjugate-13 06/28/2014  . Pneumococcal Polysaccharide-23 05/05/2012  . Zoster 01/20/2006   Pertinent  Health Maintenance Due  Topic Date Due  . INFLUENZA VACCINE  Completed  . DEXA SCAN  Completed  . PNA vac Low Risk Adult  Completed   No flowsheet data found. Functional Status Survey:    Vitals:   02/28/17 1039  BP: 132/62  Pulse: 68  Resp: 16  Temp: (!) 96.6 F (35.9 C)  TempSrc: Oral  SpO2: 98%  Weight: 151 lb 12.8 oz (68.9 kg)   There is no height or weight on file to calculate BMI. Physical Exam  Constitutional: Vital signs are normal. She appears well-developed and well-nourished. She is active and cooperative. She does not appear ill. No distress.  HENT:  Head: Normocephalic and atraumatic.  Mouth/Throat: Uvula is midline, oropharynx is clear and moist and mucous membranes are normal. Mucous membranes are not pale, not dry and not cyanotic.  Eyes: Conjunctivae, EOM and lids are normal. Pupils are equal, round, and reactive to light.  Neck: Trachea normal, normal range of motion and full passive range of motion without pain. Neck supple. No JVD present. No tracheal deviation, no edema and no erythema present. No thyromegaly present.  Cardiovascular: Normal rate, regular rhythm, normal heart sounds, intact distal pulses and normal pulses. Exam reveals no gallop, no distant heart sounds and no friction rub.  No murmur heard. Pulses:      Dorsalis pedis pulses are 2+ on the right side, and 2+ on the left side.  No edema  Pulmonary/Chest: Effort normal and breath sounds normal. No  accessory muscle usage. No respiratory distress. She has no decreased breath sounds. She has no wheezes. She has no rhonchi. She has no rales. She exhibits no tenderness.  Abdominal: Soft. Normal appearance and bowel sounds are normal. She exhibits no distension and no ascites. There is no tenderness.  Musculoskeletal: Normal range of motion. She exhibits no edema or tenderness.  Expected osteoarthritis, stiffness; Bilateral Calves soft, supple. Negative Homan's Sign. B- pedal pulses equal; ambulates independently without assistive devices  Neurological: She is alert. She has normal strength.  Skin: Skin is warm, dry and intact. She is not diaphoretic. No cyanosis. No pallor. Nails show no clubbing.  Psychiatric: She has a normal mood and affect. Her speech is normal and behavior is normal. Thought content normal. Cognition and memory are impaired. She expresses impulsivity. She exhibits abnormal recent memory and abnormal remote memory.  Nursing note and vitals reviewed.   Labs reviewed: Recent Labs    06/20/16 0430 01/16/17 0450  NA 140 138  K 3.2* 3.4*  CL 102 103  CO2 30 30  GLUCOSE 71 80  BUN 20  15  CREATININE 0.71 0.71  CALCIUM 8.9 8.9  MG 2.2 2.1   Recent Labs    06/20/16 0430 01/16/17 0450  AST 23 22  ALT 13* 13*  ALKPHOS 47 45  BILITOT 0.5 0.6  PROT 6.5 6.1*  ALBUMIN 3.6 3.4*   Recent Labs    06/20/16 0430 01/16/17 0450  WBC 6.2 5.7  NEUTROABS 3.5 3.5  HGB 13.3 13.1  HCT 39.9 38.5  MCV 90.4 89.7  PLT 262 261   Lab Results  Component Value Date   TSH 2.950 01/16/2017   No results found for: HGBA1C Lab Results  Component Value Date   CHOL 253 (H) 01/05/2015   HDL 60 01/05/2015   LDLCALC 165 (H) 01/05/2015   LDLDIRECT 110.8 02/15/2011   TRIG 142 01/05/2015   CHOLHDL 4.2 01/05/2015    Significant Diagnostic Results in last 30 days:  No results found.  Assessment/Plan Emily Summers was seen today for medical management of chronic issues.  Diagnoses  and all orders for this visit:  Essential hypertension  Allergic rhinitis, unspecified seasonality, unspecified trigger  Late onset Alzheimer's disease without behavioral disturbance   above listed conditions stable  Continue current medication regimen  Encourage pt to interact with other residents and participate in activites  Assist with meals and ADLs as appropriate  Family/ staff Communication:   Total Time:  Documentation:  Face to Face:  Family/Phone:   Labs/tests ordered:  Not due  Medication list reviewed and assessed for continued appropriateness. Monthly medication orders reviewed and signed.  Vikki Ports, NP-C Geriatrics [facility] [phone], [place] [address] Cell Phone (Mon-Fri 8am-5pm):  [phone] On Call:  [phone] & follow prompts after 5pm & weekends Office Phone:  [phone] Office Fax:  [phone]

## 2017-04-05 NOTE — Assessment & Plan Note (Signed)
Stable. No current scheduled treatment

## 2017-04-05 NOTE — Assessment & Plan Note (Signed)
Stable. No complaints of pain. Symptoms managed with Tramadol 50 mg BID scheduled

## 2017-04-05 NOTE — Assessment & Plan Note (Signed)
Stable. Remote mastectomy.

## 2017-04-05 NOTE — Progress Notes (Signed)
Location:    Nursing Home Room Number: 846N Place of Service:  ALF [phone]) Provider:  Toni Arthurs, NP-C  Toni Arthurs, NP  Patient Care Team: Toni Arthurs, NP as PCP - General (Family Medicine)  Extended Emergency Contact Information Primary Emergency Contact: Estelle Grumbles States of [place] Phone: [phone] Relation: Friend Secondary Emergency Contact: SARFATI,ELIZABETH Address: [place] Phone: [phone] Relation: None  Code Status:  FULL Goals of care: Advanced Directive information Advanced Directives 04/03/2017  Does Patient Have a Medical Advance Directive? No     Chief Complaint  Patient presents with  . Medical Management of Chronic Issues    Routine Visit    HPI:  Pt is a 84 y.o. female seen today for medical management of chronic diseases.    Osteoarthritis Stable. No complaints of pain. Symptoms managed with Tramadol 50 mg BID scheduled  Wandering in diseases classified elsewhere Stable. Pt is resident on [facility]  Personal history of malignant neoplasm of breast Stable. Remote mastectomy.   Please note pt with limited verbal ability. Unable to obtain complete ROS. Some ROS info obtained from staff and documentation.   Past Medical History:  Diagnosis Date  . Alzheimer's dementia with behavioral disturbance   . Breast cancer ([place])   . Hypertension    Past Surgical History:  Procedure Laterality Date  . ABDOMINAL HYSTERECTOMY     Still have ovaries  . BREAST BIOPSY Left 1985  . BREAST RECONSTRUCTION Left   . MASTECTOMY     Right  . TONSILLECTOMY  1942    Allergies  Allergen Reactions  . Sulfa Antibiotics     Allergies as of 04/03/2017      Reactions   Sulfa Antibiotics       Medication List        Accurate as of 04/03/17 11:59 PM. Always use your most recent med list.          amLODipine 2.5 MG tablet Commonly known as:  NORVASC Take 1 tablet by mouth daily.     CEROVITE SENIOR Tabs Take 1 tablet by mouth daily.   Cholecalciferol 2000 units Caps Take 1 capsule by mouth daily.   hydrochlorothiazide 25 MG tablet Commonly known as:  HYDRODIURIL Take 25 mg by mouth daily.   LORazepam 0.5 MG tablet Commonly known as:  ATIVAN Take 1 tablet (0.5 mg total) daily by mouth. Around 2 pm when agitation starts   memantine 5 MG tablet Commonly known as:  NAMENDA Take 5 mg by mouth 2 (two) times daily.   potassium chloride SA 20 MEQ tablet Commonly known as:  K-DUR,KLOR-CON Take 20 mEq by mouth 2 (two) times daily.   sennosides-docusate sodium 8.6-50 MG tablet Commonly known as:  SENOKOT-S Take 2 tablets by mouth 2 (two) times daily.   sertraline 100 MG tablet Commonly known as:  ZOLOFT Take 100 mg by mouth daily.   traMADol 50 MG tablet Commonly known as:  ULTRAM Take 1 tablet (50 mg total) by mouth 2 (two) times daily.   traZODone 50 MG tablet Commonly known as:  DESYREL Take 50 mg by mouth at bedtime.       Review of Systems  Unable to perform ROS: Dementia  Constitutional: Negative for activity change, appetite change, chills, diaphoresis and fever.  HENT: Negative for congestion, mouth sores, nosebleeds, postnasal drip, sneezing, sore throat, trouble swallowing and voice change.   Respiratory: Negative for apnea, cough,  choking, chest tightness, shortness of breath and wheezing.   Cardiovascular: Negative for chest pain, palpitations and leg swelling.  Gastrointestinal: Negative for abdominal distention, abdominal pain, constipation, diarrhea and nausea.  Genitourinary: Negative for difficulty urinating, dysuria, frequency and urgency.  Musculoskeletal: Positive for arthralgias (typical arthritis). Negative for back pain, gait problem and myalgias.  Skin: Negative for color change, pallor, rash and wound.  Neurological: Negative for dizziness, tremors, syncope, speech difficulty, weakness, numbness and headaches.   Psychiatric/Behavioral: Positive for confusion. Negative for agitation and behavioral problems.  All other systems reviewed and are negative.   Immunization History  Administered Date(s) Administered  . Influenza Split 01/15/2011  . Influenza-Unspecified 12/16/2012, 12/27/2015, 12/10/2016  . PPD Test 04/12/2016  . Pneumococcal Conjugate-13 06/28/2014  . Pneumococcal Polysaccharide-23 05/05/2012  . Zoster 01/20/2006   Pertinent  Health Maintenance Due  Topic Date Due  . INFLUENZA VACCINE  Completed  . DEXA SCAN  Completed  . PNA vac Low Risk Adult  Completed   No flowsheet data found. Functional Status Survey:    Vitals:   04/03/17 1152  BP: 120/63  Pulse: 63  Resp: 18  Temp: 97.6 F (36.4 C)  TempSrc: Oral  SpO2: 100%  Weight: 152 lb 1.6 oz (69 kg)  Height: 5\' 1"  (1.549 m)   Body mass index is 28.74 kg/m. Physical Exam  Constitutional: Vital signs are normal. She appears well-developed and well-nourished. She is active and cooperative. She does not appear ill. No distress.  HENT:  Head: Normocephalic and atraumatic.  Mouth/Throat: Uvula is midline, oropharynx is clear and moist and mucous membranes are normal. Mucous membranes are not pale, not dry and not cyanotic.  Eyes: Conjunctivae, EOM and lids are normal. Pupils are equal, round, and reactive to light.  Neck: Trachea normal, normal range of motion and full passive range of motion without pain. Neck supple. No JVD present. No tracheal deviation, no edema and no erythema present. No thyromegaly present.  Cardiovascular: Normal rate, regular rhythm, normal heart sounds, intact distal pulses and normal pulses. Exam reveals no gallop, no distant heart sounds and no friction rub.  No murmur heard. Pulses:      Dorsalis pedis pulses are 2+ on the right side, and 2+ on the left side.  No edema  Pulmonary/Chest: Effort normal and breath sounds normal. No accessory muscle usage. No respiratory distress. She has no  decreased breath sounds. She has no wheezes. She has no rhonchi. She has no rales. She exhibits no tenderness.  Abdominal: Soft. Normal appearance and bowel sounds are normal. She exhibits no distension and no ascites. There is no tenderness.  Musculoskeletal: Normal range of motion. She exhibits no edema or tenderness.  Expected osteoarthritis, stiffness; Bilateral Calves soft, supple. Negative Homan's Sign. B- pedal pulses equal; ambulates independently without use of assistive devices  Neurological: She is alert. She has normal strength.  Skin: Skin is warm, dry and intact. She is not diaphoretic. No cyanosis. No pallor. Nails show no clubbing.  Psychiatric: She has a normal mood and affect. Her speech is normal and behavior is normal. Thought content normal. Cognition and memory are impaired. She expresses impulsivity. She exhibits abnormal recent memory and abnormal remote memory.  Nursing note and vitals reviewed.   Labs reviewed: Recent Labs    06/20/16 0430 01/16/17 0450  NA 140 138  K 3.2* 3.4*  CL 102 103  CO2 30 30  GLUCOSE 71 80  BUN 20 15  CREATININE 0.71 0.71  CALCIUM 8.9 8.9  MG 2.2 2.1   Recent Labs    06/20/16 0430 01/16/17 0450  AST 23 22  ALT 13* 13*  ALKPHOS 47 45  BILITOT 0.5 0.6  PROT 6.5 6.1*  ALBUMIN 3.6 3.4*   Recent Labs    06/20/16 0430 01/16/17 0450  WBC 6.2 5.7  NEUTROABS 3.5 3.5  HGB 13.3 13.1  HCT 39.9 38.5  MCV 90.4 89.7  PLT 262 261   Lab Results  Component Value Date   TSH 2.950 01/16/2017   No results found for: HGBA1C Lab Results  Component Value Date   CHOL 253 (H) 01/05/2015   HDL 60 01/05/2015   LDLCALC 165 (H) 01/05/2015   LDLDIRECT 110.8 02/15/2011   TRIG 142 01/05/2015   CHOLHDL 4.2 01/05/2015    Significant Diagnostic Results in last 30 days:  No results found.  Assessment/Plan Torian was seen today for medical management of chronic issues.  Diagnoses and all orders for this visit:  Osteoarthritis,  unspecified osteoarthritis type, unspecified site  Wandering in diseases classified elsewhere  Personal history of malignant neoplasm of breast   above listed conditions stable  Continue current medication regimen  Monitor for safety, fall risk  Monitor for nonverbal s/s of pain  Family/ staff Communication:   Total Time:  Documentation:  Face to Face:  Family/Phone:   Labs/tests ordered:  Not due  Medication list reviewed and assessed for continued appropriateness. Monthly medication orders reviewed and signed.  Vikki Ports, NP-C Geriatrics [facility] [phone], [place] [address] Cell Phone (Mon-Fri 8am-5pm):  [phone] On Call:  [phone] & follow prompts after 5pm & weekends Office Phone:  [phone] Office Fax:  [phone]

## 2017-04-05 NOTE — Assessment & Plan Note (Signed)
Stable. Pt is resident on secured [place] Unit

## 2017-04-05 NOTE — Assessment & Plan Note (Signed)
Stable. No episodes of hypotension. Symptoms controlled with Norvasc and HCTZ with potassium chloride

## 2017-04-05 NOTE — Assessment & Plan Note (Signed)
Progressive. Pt is resident on [place] unit. Treatment with Namenda. Pt spends a lot of time alone holding her babydoll or tearing magazines.

## 2017-04-16 ENCOUNTER — Other Ambulatory Visit: Payer: Self-pay

## 2017-04-16 MED ORDER — TRAMADOL HCL 50 MG PO TABS: 50.0000 mg | ORAL_TABLET | Freq: Two times a day (BID) | ORAL | 2 refills | Status: DC

## 2017-04-16 NOTE — Telephone Encounter (Signed)
Rx sent to [facility] phone : [phone] , fax : [phone]  

## 2017-04-18 ENCOUNTER — Encounter
Admission: RE | Admit: 2017-04-18 | Discharge: 2017-04-18 | Disposition: A | Discharge status: Home / Self Care | Payer: Medicare Other | Source: Ambulatory Visit | Attending: Internal Medicine | Admitting: Internal Medicine

## 2017-05-02 ENCOUNTER — Non-Acute Institutional Stay: Payer: Medicare Other | Admitting: Gerontology

## 2017-05-02 ENCOUNTER — Encounter: Payer: Self-pay | Admitting: Gerontology

## 2017-05-02 DIAGNOSIS — J309 Allergic rhinitis, unspecified: Secondary | ICD-10-CM

## 2017-05-02 DIAGNOSIS — M1990 Unspecified osteoarthritis, unspecified site: Secondary | ICD-10-CM | POA: Diagnosis not present

## 2017-05-02 DIAGNOSIS — Z9183 Wandering in diseases classified elsewhere: Secondary | ICD-10-CM | POA: Diagnosis not present

## 2017-05-16 ENCOUNTER — Encounter
Admission: RE | Admit: 2017-05-16 | Discharge: 2017-05-16 | Disposition: A | Discharge status: Home / Self Care | Payer: Medicare Other | Source: Ambulatory Visit | Attending: Internal Medicine | Admitting: Internal Medicine

## 2017-06-02 ENCOUNTER — Non-Acute Institutional Stay: Payer: Medicare Other | Admitting: Gerontology

## 2017-06-02 DIAGNOSIS — I10 Essential (primary) hypertension: Secondary | ICD-10-CM

## 2017-06-02 DIAGNOSIS — F329 Major depressive disorder, single episode, unspecified: Secondary | ICD-10-CM | POA: Diagnosis not present

## 2017-06-02 DIAGNOSIS — G301 Alzheimer's disease with late onset: Secondary | ICD-10-CM

## 2017-06-02 DIAGNOSIS — F02818 Dementia in other diseases classified elsewhere, unspecified severity, with other behavioral disturbance: Secondary | ICD-10-CM

## 2017-06-02 DIAGNOSIS — F0281 Dementia in other diseases classified elsewhere with behavioral disturbance: Secondary | ICD-10-CM

## 2017-06-02 NOTE — Assessment & Plan Note (Signed)
Stable. No complaints of pain. Had fairly recent fall- last month. No injuries. Seemed sore for a few days after, but no lasting effects. Symptoms controlled with APAP 650 mg po TID scheduled and Tramadol 50 mg po BID.

## 2017-06-02 NOTE — Assessment & Plan Note (Signed)
Controlled. Denies chest pains or shortness of breath. On HCTZ 25 mg with Potassium chloride 20 meq and norvasc 2.5 mg daily. No episodes of hypotension

## 2017-06-02 NOTE — Progress Notes (Signed)
Location:    Nursing Home Room Number: 630Z Place of Service:  ALF [phone]) Provider:  Toni Arthurs, NP-C  Toni Arthurs, NP  Patient Care Team: Toni Arthurs, NP as PCP - General (Family Medicine)  Extended Emergency Contact Information Primary Emergency Contact: Estelle Grumbles States of [place] Phone: [phone] Relation: Friend Secondary Emergency Contact: SARFATI,ELIZABETH Address: [place] Phone: [phone] Relation: None  Code Status:  full Goals of care: Advanced Directive information Advanced Directives 05/02/2017  Does Patient Have a Medical Advance Directive? No     Chief Complaint  Patient presents with  . Medical Management of Chronic Issues    Routine Visit    HPI:  Pt is a 84 y.o. female seen today for medical management of chronic diseases.    Allergic rhinitis Stable. No current issues. No cough or post-nasal drip noted. No daily treatment at this time.   Osteoarthritis Stable. No complaints of pain. Had fairly recent fall- last month. No injuries. Seemed sore for a few days after, but no lasting effects. Symptoms controlled with APAP 650 mg po TID scheduled and Tramadol 50 mg po BID.   Wandering in diseases classified elsewhere Stable. Pt does not wander when she is on Ativan 0.5 mg po Q Day at 1400. GDR attempted in December with failed results- pt had increased agitation/ wandering, taking pictures off walls, taking other residents belongings, etc. Behaviors managed with medication.   Please note pt with limited verbal ability. Unable to obtain complete ROS. Some ROS info obtained from staff and documentation.   Past Medical History:  Diagnosis Date  . Alzheimer's dementia with behavioral disturbance   . Breast cancer ([place])   . Hypertension    Past Surgical History:  Procedure Laterality Date  . ABDOMINAL HYSTERECTOMY     Still have ovaries  . BREAST BIOPSY Left 1985  . BREAST RECONSTRUCTION Left   .  MASTECTOMY     Right  . TONSILLECTOMY  1942    Allergies  Allergen Reactions  . Sulfa Antibiotics     Allergies as of 05/02/2017      Reactions   Sulfa Antibiotics       Medication List        Accurate as of 05/02/17 11:59 PM. Always use your most recent med list.          acetaminophen 325 MG tablet Commonly known as:  TYLENOL Take 650 mg by mouth 3 (three) times daily.   amLODipine 2.5 MG tablet Commonly known as:  NORVASC Take 1 tablet by mouth daily.   CEROVITE SENIOR Tabs Take 1 tablet by mouth daily.   Cholecalciferol 2000 units Caps Take 1 capsule by mouth daily.   hydrochlorothiazide 25 MG tablet Commonly known as:  HYDRODIURIL Take 25 mg by mouth daily.   LORazepam 0.5 MG tablet Commonly known as:  ATIVAN Take 1 tablet (0.5 mg total) daily by mouth. Around 2 pm when agitation starts   memantine 5 MG tablet Commonly known as:  NAMENDA Take 5 mg by mouth 2 (two) times daily.   potassium chloride SA 20 MEQ tablet Commonly known as:  K-DUR,KLOR-CON Take 20 mEq by mouth 2 (two) times daily.   sennosides-docusate sodium 8.6-50 MG tablet Commonly known as:  SENOKOT-S Take 2 tablets by mouth 2 (two) times daily.   sertraline 100 MG tablet Commonly known as:  ZOLOFT Take 100 mg by mouth daily.   traMADol 50 MG tablet  Commonly known as:  ULTRAM Take 1 tablet (50 mg total) by mouth 2 (two) times daily.   traZODone 50 MG tablet Commonly known as:  DESYREL Take 50 mg by mouth at bedtime.       Review of Systems  Unable to perform ROS: Dementia  Constitutional: Negative for activity change, appetite change, chills, diaphoresis and fever.  HENT: Negative for congestion, mouth sores, nosebleeds, postnasal drip, sneezing, sore throat, trouble swallowing and voice change.   Respiratory: Negative for apnea, cough, choking, chest tightness, shortness of breath and wheezing.   Cardiovascular: Negative for chest pain, palpitations and leg swelling.    Gastrointestinal: Negative for abdominal distention, abdominal pain, constipation, diarrhea and nausea.  Genitourinary: Negative for difficulty urinating, dysuria, frequency and urgency.  Musculoskeletal: Positive for arthralgias (typical arthritis). Negative for back pain, gait problem and myalgias.  Skin: Negative for color change, pallor, rash and wound.  Neurological: Positive for weakness. Negative for dizziness, tremors, syncope, speech difficulty, numbness and headaches.  Psychiatric/Behavioral: Positive for agitation and confusion. Negative for behavioral problems.  All other systems reviewed and are negative.   Immunization History  Administered Date(s) Administered  . Influenza Split 01/15/2011  . Influenza-Unspecified 12/16/2012, 12/27/2015, 12/10/2016  . PPD Test 04/12/2016  . Pneumococcal Conjugate-13 06/28/2014  . Pneumococcal Polysaccharide-23 05/05/2012  . Zoster 01/20/2006   Pertinent  Health Maintenance Due  Topic Date Due  . INFLUENZA VACCINE  Completed  . DEXA SCAN  Completed  . PNA vac Low Risk Adult  Completed   No flowsheet data found. Functional Status Survey:    Vitals:   05/02/17 0823  BP: 120/63  Pulse: 63  Resp: 18  Temp: 97.6 F (36.4 C)  TempSrc: Oral  SpO2: 100%  Weight: 149 lb 3.2 oz (67.7 kg)  Height: 5\' 1"  (1.549 m)   Body mass index is 28.19 kg/m. Physical Exam  Constitutional: Vital signs are normal. She appears well-developed and well-nourished. She is active and cooperative. She does not appear ill. No distress.  HENT:  Head: Normocephalic and atraumatic.  Mouth/Throat: Uvula is midline, oropharynx is clear and moist and mucous membranes are normal. Mucous membranes are not pale, not dry and not cyanotic.  Eyes: Conjunctivae, EOM and lids are normal. Pupils are equal, round, and reactive to light.  Neck: Trachea normal, normal range of motion and full passive range of motion without pain. Neck supple. No JVD present. No tracheal  deviation, no edema and no erythema present. No thyromegaly present.  Cardiovascular: Normal rate, regular rhythm, normal heart sounds, intact distal pulses and normal pulses. Exam reveals no gallop, no distant heart sounds and no friction rub.  No murmur heard. Pulses:      Dorsalis pedis pulses are 2+ on the right side, and 2+ on the left side.  No edema  Pulmonary/Chest: Effort normal and breath sounds normal. No accessory muscle usage. No respiratory distress. She has no decreased breath sounds. She has no wheezes. She has no rhonchi. She has no rales. She exhibits no tenderness.  Abdominal: Soft. Normal appearance and bowel sounds are normal. She exhibits no distension and no ascites. There is no tenderness.  Musculoskeletal: Normal range of motion. She exhibits no edema or tenderness.  Expected osteoarthritis, stiffness; Bilateral Calves soft, supple. Negative Homan's Sign. B- pedal pulses equal; generalized weakness  Neurological: She is alert. She has normal strength. She displays atrophy. A cranial nerve deficit is present. She exhibits abnormal muscle tone. Coordination and gait abnormal.  Skin: Skin is warm,  dry and intact. She is not diaphoretic. No cyanosis. No pallor. Nails show no clubbing.  Psychiatric: She has a normal mood and affect. Her speech is normal. Thought content normal. She is slowed. Cognition and memory are impaired. She expresses impulsivity and inappropriate judgment. She exhibits abnormal recent memory and abnormal remote memory.  Nursing note and vitals reviewed.   Labs reviewed: Recent Labs    06/20/16 0430 01/16/17 0450  NA 140 138  K 3.2* 3.4*  CL 102 103  CO2 30 30  GLUCOSE 71 80  BUN 20 15  CREATININE 0.71 0.71  CALCIUM 8.9 8.9  MG 2.2 2.1   Recent Labs    06/20/16 0430 01/16/17 0450  AST 23 22  ALT 13* 13*  ALKPHOS 47 45  BILITOT 0.5 0.6  PROT 6.5 6.1*  ALBUMIN 3.6 3.4*   Recent Labs    06/20/16 0430 01/16/17 0450  WBC 6.2 5.7    NEUTROABS 3.5 3.5  HGB 13.3 13.1  HCT 39.9 38.5  MCV 90.4 89.7  PLT 262 261   Lab Results  Component Value Date   TSH 2.950 01/16/2017   No results found for: HGBA1C Lab Results  Component Value Date   CHOL 253 (H) 01/05/2015   HDL 60 01/05/2015   LDLCALC 165 (H) 01/05/2015   LDLDIRECT 110.8 02/15/2011   TRIG 142 01/05/2015   CHOLHDL 4.2 01/05/2015    Significant Diagnostic Results in last 30 days:  No results found.  Assessment/Plan Emily Summers was seen today for medical management of chronic issues.  Diagnoses and all orders for this visit:  Allergic rhinitis, unspecified seasonality, unspecified trigger  Osteoarthritis, unspecified osteoarthritis type, unspecified site  Wandering in diseases classified elsewhere   above conditions stable  Continue current regimen  Monitor for falls/ safety  Encourage interaction with others  Redirect as needed  Family/ staff Communication:   Total Time:  Documentation:  Face to Face:  Family/Phone:   Labs/tests ordered:  Not due  Medication list reviewed and assessed for continued appropriateness. Monthly medication orders reviewed and signed.  Vikki Ports, NP-C Geriatrics [facility] [phone], [place] [address] Cell Phone (Mon-Fri 8am-5pm):  [phone] On Call:  [phone] & follow prompts after 5pm & weekends Office Phone:  [phone] Office Fax:  [phone]

## 2017-06-02 NOTE — Assessment & Plan Note (Signed)
Stable. Pt tends to have increased agitation in the afternoons. Ativan 0.5 mg po Q Day given a 1400 controls the agitation without sedation. Attempted to DC medication in December. Behaviors returned: wandering, taking things off the walls and counters, takes others belongings out of their rooms, unable to focus/ restless, etc.

## 2017-06-02 NOTE — Progress Notes (Signed)
Location:      Place of Service:  ALF (13) Provider:  Toni Arthurs, NP-C  Toni Arthurs, NP  Patient Care Team: Toni Arthurs, NP as PCP - General (Family Medicine)  Extended Emergency Contact Information Primary Emergency Contact: Estelle Grumbles [place] of [place] Phone: [phone] Relation: Friend Secondary Emergency Contact: SARFATI,ELIZABETH Address: [place] Phone: [phone] Relation: None  Code Status:  full Goals of care: Advanced Directive information Advanced Directives 05/02/2017  Does Patient Have a Medical Advance Directive? No     Chief Complaint  Patient presents with  . Medical Management of Chronic Issues    HPI:  Pt is a 84 y.o. female seen today for medical management of chronic diseases.    Major depression, single episode Stable. No reports of crying, withdrawal, loss of appetite, etc. Pt remains on Zoloft 100 mg po Q Day for depression. Pt is content to "care" for her babydoll   Alzheimer's dementia with behavioral disturbance Stable. Pt tends to have increased agitation in the afternoons. Ativan 0.5 mg po Q Day given a 1400 controls the agitation without sedation. Attempted to DC medication in December. Behaviors returned: wandering, taking things off the walls and counters, takes others belongings out of their rooms, unable to focus/ restless, etc.   BP (high blood pressure) Controlled. Denies chest pains or shortness of breath. On HCTZ 25 mg with Potassium chloride 20 meq and norvasc 2.5 mg daily. No episodes of hypotension  Please note pt with limited verbal ability. Unable to obtain complete ROS. Some ROS info obtained from staff and documentation.   Past Medical History:  Diagnosis Date  . Alzheimer's dementia with behavioral disturbance   . Breast cancer ([place])   . Hypertension    Past Surgical History:  Procedure Laterality Date  . ABDOMINAL HYSTERECTOMY     Still have ovaries  . BREAST  BIOPSY Left 1985  . BREAST RECONSTRUCTION Left   . MASTECTOMY     Right  . TONSILLECTOMY  1942    Allergies  Allergen Reactions  . Sulfa Antibiotics     Allergies as of 06/02/2017      Reactions   Sulfa Antibiotics       Medication List        Accurate as of 06/02/17  4:24 PM. Always use your most recent med list.          acetaminophen 325 MG tablet Commonly known as:  TYLENOL Take 650 mg by mouth 3 (three) times daily.   amLODipine 2.5 MG tablet Commonly known as:  NORVASC Take 1 tablet by mouth daily.   CEROVITE SENIOR Tabs Take 1 tablet by mouth daily.   Cholecalciferol 2000 units Caps Take 1 capsule by mouth daily.   hydrochlorothiazide 25 MG tablet Commonly known as:  HYDRODIURIL Take 25 mg by mouth daily.   LORazepam 0.5 MG tablet Commonly known as:  ATIVAN Take 0.5 mg by mouth daily. agitation that starts at around 2:00; OK to hold for sedation   memantine 5 MG tablet Commonly known as:  NAMENDA Take 5 mg by mouth 2 (two) times daily.   potassium chloride SA 20 MEQ tablet Commonly known as:  K-DUR,KLOR-CON Take 20 mEq by mouth 2 (two) times daily.   sennosides-docusate sodium 8.6-50 MG tablet Commonly known as:  SENOKOT-S Take 2 tablets by mouth 2 (two) times daily.   sertraline 100 MG tablet Commonly known as:  ZOLOFT Take 100 mg  by mouth daily.   traMADol 50 MG tablet Commonly known as:  ULTRAM Take 1 tablet (50 mg total) by mouth 2 (two) times daily.   traZODone 50 MG tablet Commonly known as:  DESYREL Take 50 mg by mouth at bedtime.       Review of Systems  Unable to perform ROS: Dementia  Constitutional: Negative for activity change, appetite change, chills, diaphoresis and fever.  HENT: Negative for congestion, mouth sores, nosebleeds, postnasal drip, sneezing, sore throat, trouble swallowing and voice change.   Respiratory: Negative for apnea, cough, choking, chest tightness, shortness of breath and wheezing.     Cardiovascular: Negative for chest pain, palpitations and leg swelling.  Gastrointestinal: Negative for abdominal distention, abdominal pain, constipation, diarrhea and nausea.  Genitourinary: Negative for difficulty urinating, dysuria, frequency and urgency.  Musculoskeletal: Positive for arthralgias (typical arthritis). Negative for back pain, gait problem and myalgias.  Skin: Negative for color change, pallor, rash and wound.  Neurological: Negative for dizziness, tremors, syncope, speech difficulty, weakness, numbness and headaches.  Psychiatric/Behavioral: Negative for agitation and behavioral problems.  All other systems reviewed and are negative.   Immunization History  Administered Date(s) Administered  . Influenza Split 01/15/2011  . Influenza-Unspecified 12/16/2012, 12/27/2015, 12/10/2016  . PPD Test 04/12/2016  . Pneumococcal Conjugate-13 06/28/2014  . Pneumococcal Polysaccharide-23 05/05/2012  . Zoster 01/20/2006   Pertinent  Health Maintenance Due  Topic Date Due  . INFLUENZA VACCINE  Completed  . DEXA SCAN  Completed  . PNA vac Low Risk Adult  Completed   No flowsheet data found. Functional Status Survey:    Vitals:   05/30/17 1700  BP: (!) 146/76  Pulse: 76  Resp: 18  Temp: (!) 96 F (35.6 C)  SpO2: 98%  Weight: 148 lb 9.6 oz (67.4 kg)   Body mass index is 28.08 kg/m. Physical Exam  Constitutional: Vital signs are normal. She appears well-developed and well-nourished. She is active and cooperative. She does not appear ill. No distress.  HENT:  Head: Normocephalic and atraumatic.  Mouth/Throat: Uvula is midline, oropharynx is clear and moist and mucous membranes are normal. Mucous membranes are not pale, not dry and not cyanotic.  Eyes: Conjunctivae, EOM and lids are normal. Pupils are equal, round, and reactive to light.  Neck: Trachea normal, normal range of motion and full passive range of motion without pain. Neck supple. No JVD present. No tracheal  deviation, no edema and no erythema present. No thyromegaly present.  Cardiovascular: Normal rate, regular rhythm, normal heart sounds, intact distal pulses and normal pulses. Exam reveals no gallop, no distant heart sounds and no friction rub.  No murmur heard. Pulses:      Dorsalis pedis pulses are 2+ on the right side, and 2+ on the left side.  No edema  Pulmonary/Chest: Effort normal and breath sounds normal. No accessory muscle usage. No respiratory distress. She has no decreased breath sounds. She has no wheezes. She has no rhonchi. She has no rales. She exhibits no tenderness.  Abdominal: Soft. Normal appearance and bowel sounds are normal. She exhibits no distension and no ascites. There is no tenderness.  Musculoskeletal: Normal range of motion. She exhibits no edema or tenderness.  Expected osteoarthritis, stiffness; Bilateral Calves soft, supple. Negative Homan's Sign. B- pedal pulses equal; generalized weakness  Neurological: She is alert. She has normal strength. A sensory deficit is present. Coordination and gait abnormal.  Skin: Skin is warm, dry and intact. She is not diaphoretic. No cyanosis. No pallor. Nails  show no clubbing.  Psychiatric: Her speech is normal. Thought content normal. Her mood appears anxious (at times). She is slowed. Cognition and memory are impaired. She expresses impulsivity. She exhibits abnormal recent memory and abnormal remote memory.  Nursing note and vitals reviewed.   Labs reviewed: Recent Labs    06/20/16 0430 01/16/17 0450  NA 140 138  K 3.2* 3.4*  CL 102 103  CO2 30 30  GLUCOSE 71 80  BUN 20 15  CREATININE 0.71 0.71  CALCIUM 8.9 8.9  MG 2.2 2.1   Recent Labs    06/20/16 0430 01/16/17 0450  AST 23 22  ALT 13* 13*  ALKPHOS 47 45  BILITOT 0.5 0.6  PROT 6.5 6.1*  ALBUMIN 3.6 3.4*   Recent Labs    06/20/16 0430 01/16/17 0450  WBC 6.2 5.7  NEUTROABS 3.5 3.5  HGB 13.3 13.1  HCT 39.9 38.5  MCV 90.4 89.7  PLT 262 261   Lab  Results  Component Value Date   TSH 2.950 01/16/2017   No results found for: HGBA1C Lab Results  Component Value Date   CHOL 253 (H) 01/05/2015   HDL 60 01/05/2015   LDLCALC 165 (H) 01/05/2015   LDLDIRECT 110.8 02/15/2011   TRIG 142 01/05/2015   CHOLHDL 4.2 01/05/2015    Significant Diagnostic Results in last 30 days:  No results found.  Assessment/Plan Emily Summers was seen today for medical management of chronic issues.  Diagnoses and all orders for this visit:  Major depressive disorder with single episode, remission status unspecified  Late onset Alzheimer's disease with behavioral disturbance  Essential hypertension   above listed conditions stable  Continue current medication regimen  Continue to encourage pt interaction in activities and with others  Redirect pt as appropriate  Monitor for safety  Fall risk   Family/ staff Communication:   Total Time:  Documentation:  Face to Face:  Family/Phone:   Labs/tests ordered:  Not due  Medication list reviewed and assessed for continued appropriateness. Monthly medication orders reviewed and signed.  Vikki Ports, NP-C [facility] [phone], [place] [address] Cell Phone (Mon-Fri 8am-5pm):  [phone] On Call:  [phone] & follow prompts after 5pm & weekends Office Phone:  [phone] Office Fax:  [phone]

## 2017-06-02 NOTE — Assessment & Plan Note (Signed)
Stable. Pt does not wander when she is on Ativan 0.5 mg po Q Day at 1400. GDR attempted in December with failed results- pt had increased agitation/ wandering, taking pictures off walls, taking other residents belongings, etc. Behaviors managed with medication.

## 2017-06-02 NOTE — Assessment & Plan Note (Signed)
Stable. No current issues. No cough or post-nasal drip noted. No daily treatment at this time.

## 2017-06-02 NOTE — Assessment & Plan Note (Signed)
Stable. No reports of crying, withdrawal, loss of appetite, etc. Pt remains on Zoloft 100 mg po Q Day for depression. Pt is content to "care" for her babydoll

## 2017-06-16 ENCOUNTER — Encounter
Admission: RE | Admit: 2017-06-16 | Discharge: 2017-06-16 | Disposition: A | Discharge status: Home / Self Care | Payer: Medicare Other | Source: Ambulatory Visit | Attending: Internal Medicine | Admitting: Internal Medicine

## 2017-06-24 ENCOUNTER — Other Ambulatory Visit: Payer: Self-pay

## 2017-06-24 MED ORDER — LORAZEPAM 0.5 MG PO TABS: 0.5000 mg | ORAL_TABLET | Freq: Every day | ORAL | 2 refills | Status: DC

## 2017-06-24 NOTE — Telephone Encounter (Signed)
Rx sent to [facility] phone : [phone] , fax : [phone]  

## 2017-07-02 ENCOUNTER — Non-Acute Institutional Stay: Payer: Medicare Other | Admitting: Gerontology

## 2017-07-02 ENCOUNTER — Encounter: Payer: Self-pay | Admitting: Gerontology

## 2017-07-02 DIAGNOSIS — Z9183 Wandering in diseases classified elsewhere: Secondary | ICD-10-CM

## 2017-07-02 DIAGNOSIS — M1990 Unspecified osteoarthritis, unspecified site: Secondary | ICD-10-CM

## 2017-07-02 DIAGNOSIS — J309 Allergic rhinitis, unspecified: Secondary | ICD-10-CM

## 2017-07-02 NOTE — Assessment & Plan Note (Signed)
Stable. No increase in symptoms despite high pollen count as of late. Not on daily regimen.

## 2017-07-02 NOTE — Assessment & Plan Note (Signed)
Stable. Pt doesn't wander as much as of late. Sits in the common area most of the day. On Namenda 5 mg po BID for dementia

## 2017-07-02 NOTE — Progress Notes (Signed)
Location:    Nursing Home Room Number: 601U Place of Service:  ALF [phone]) Provider:  Toni Arthurs, NP-C  Toni Arthurs, NP  Patient Care Team: Toni Arthurs, NP as PCP - General (Family Medicine)  Extended Emergency Contact Information Primary Emergency Contact: Estelle Grumbles States of [place] Phone: [phone] Relation: Friend Secondary Emergency Contact: SARFATI,ELIZABETH Address: [place] Phone: [phone] Relation: None  Code Status:  FULL Goals of care: Advanced Directive information Advanced Directives 07/02/2017  Does Patient Have a Medical Advance Directive? No     Chief Complaint  Patient presents with  . Medical Management of Chronic Issues    Routine Visit    HPI:  Pt is a 84 y.o. female seen today for medical management of chronic diseases.    Allergic rhinitis Stable. No increase in symptoms despite high pollen count as of late. Not on daily regimen.  Osteoarthritis Stable. No recent complaints of worsening symptoms. Symptoms controlled with Tylenol 650 mg po TID and Tramadol 50 mg po BID  Wandering in diseases classified elsewhere Stable. Pt doesn't wander as much as of late. Sits in the common area most of the day. On Namenda 5 mg po BID for dementia  Please note pt with limited verbal/cognitive ability. Unable to obtain complete ROS. Some ROS info obtained from staff and documentation.   Past Medical History:  Diagnosis Date  . Alzheimer's dementia with behavioral disturbance   . Breast cancer ([place])   . Hypertension    Past Surgical History:  Procedure Laterality Date  . ABDOMINAL HYSTERECTOMY     Still have ovaries  . BREAST BIOPSY Left 1985  . BREAST RECONSTRUCTION Left   . MASTECTOMY     Right  . TONSILLECTOMY  1942    Allergies  Allergen Reactions  . Sulfa Antibiotics     Allergies as of 07/02/2017      Reactions   Sulfa Antibiotics       Medication List        Accurate as of  07/02/17  7:55 PM. Always use your most recent med list.          acetaminophen 325 MG tablet Commonly known as:  TYLENOL Take 650 mg by mouth 3 (three) times daily.   amLODipine 2.5 MG tablet Commonly known as:  NORVASC Take 1 tablet by mouth daily.   CEROVITE SENIOR Tabs Take 1 tablet by mouth daily.   Cholecalciferol 2000 units Caps Take 1 capsule by mouth daily.   hydrochlorothiazide 25 MG tablet Commonly known as:  HYDRODIURIL Take 25 mg by mouth daily.   LORazepam 0.5 MG tablet Commonly known as:  ATIVAN Take 1 tablet (0.5 mg total) by mouth daily. agitation that starts at around 2:00; OK to hold for sedation   memantine 5 MG tablet Commonly known as:  NAMENDA Take 5 mg by mouth 2 (two) times daily.   potassium chloride SA 20 MEQ tablet Commonly known as:  K-DUR,KLOR-CON Take 20 mEq by mouth 2 (two) times daily.   sennosides-docusate sodium 8.6-50 MG tablet Commonly known as:  SENOKOT-S Take 2 tablets by mouth 2 (two) times daily.   sertraline 100 MG tablet Commonly known as:  ZOLOFT Take 100 mg by mouth daily.   traMADol 50 MG tablet Commonly known as:  ULTRAM Take 1 tablet (50 mg total) by mouth 2 (two) times daily.   traZODone 50 MG tablet Commonly known as:  DESYREL Take 50 mg  by mouth at bedtime.       Review of Systems  Unable to perform ROS: Dementia  Constitutional: Negative for activity change, appetite change, chills, diaphoresis and fever.  HENT: Negative for congestion, mouth sores, nosebleeds, postnasal drip, sneezing, sore throat, trouble swallowing and voice change.   Respiratory: Negative for apnea, cough, choking, chest tightness, shortness of breath and wheezing.   Cardiovascular: Negative for chest pain, palpitations and leg swelling.  Gastrointestinal: Negative for abdominal distention, abdominal pain, constipation, diarrhea and nausea.  Genitourinary: Negative for difficulty urinating, dysuria, frequency and urgency.    Musculoskeletal: Negative for back pain, gait problem and myalgias. Arthralgias: typical arthritis.  Skin: Negative for color change, pallor, rash and wound.  Neurological: Negative for dizziness, tremors, syncope, speech difficulty, weakness, numbness and headaches.  Psychiatric/Behavioral: Negative for agitation and behavioral problems.  All other systems reviewed and are negative.   Immunization History  Administered Date(s) Administered  . Influenza Split 01/15/2011  . Influenza-Unspecified 12/16/2012, 12/27/2015, 12/10/2016  . PPD Test 04/12/2016  . Pneumococcal Conjugate-13 06/28/2014  . Pneumococcal Polysaccharide-23 05/05/2012  . Zoster 01/20/2006   Pertinent  Health Maintenance Due  Topic Date Due  . INFLUENZA VACCINE  10/16/2017  . DEXA SCAN  Completed  . PNA vac Low Risk Adult  Completed   No flowsheet data found. Functional Status Survey:    Vitals:   07/02/17 1037  BP: 134/78  Pulse: 77  Resp: 18  Temp: 98 F (36.7 C)  TempSrc: Oral  SpO2: 100%  Weight: 151 lb 4.8 oz (68.6 kg)  Height: 5\' 1"  (1.549 m)   Body mass index is 28.59 kg/m. Physical Exam  Constitutional: She is oriented to person, place, and time. Vital signs are normal. She appears well-developed and well-nourished. She is active and cooperative. She does not appear ill. No distress.  HENT:  Head: Normocephalic and atraumatic.  Mouth/Throat: Uvula is midline, oropharynx is clear and moist and mucous membranes are normal. Mucous membranes are not pale, not dry and not cyanotic.  Eyes: Pupils are equal, round, and reactive to light. Conjunctivae, EOM and lids are normal.  Neck: Trachea normal, normal range of motion and full passive range of motion without pain. Neck supple. No JVD present. No tracheal deviation, no edema and no erythema present. No thyromegaly present.  Cardiovascular: Normal rate, regular rhythm, normal heart sounds, intact distal pulses and normal pulses. Exam reveals no  gallop, no distant heart sounds and no friction rub.  No murmur heard. Pulses:      Dorsalis pedis pulses are 2+ on the right side, and 2+ on the left side.  No edema  Pulmonary/Chest: Effort normal and breath sounds normal. No accessory muscle usage. No respiratory distress. She has no decreased breath sounds. She has no wheezes. She has no rhonchi. She has no rales. She exhibits no tenderness.  Abdominal: Soft. Normal appearance and bowel sounds are normal. She exhibits no distension and no ascites. There is no tenderness.  Musculoskeletal: Normal range of motion. She exhibits no edema or tenderness.  Expected osteoarthritis, stiffness; Bilateral Calves soft, supple. Negative Homan's Sign. B- pedal pulses equal  Neurological: She is alert and oriented to person, place, and time. She has normal strength.  Skin: Skin is warm, dry and intact. She is not diaphoretic. No cyanosis. No pallor. Nails show no clubbing.  Psychiatric: Her speech is normal. Judgment and thought content normal. Her affect is blunt. She is slowed. Cognition and memory are impaired. She exhibits abnormal recent memory  and abnormal remote memory. She is inattentive.  Nursing note and vitals reviewed.   Labs reviewed: Recent Labs    01/16/17 0450  NA 138  K 3.4*  CL 103  CO2 30  GLUCOSE 80  BUN 15  CREATININE 0.71  CALCIUM 8.9  MG 2.1   Recent Labs    01/16/17 0450  AST 22  ALT 13*  ALKPHOS 45  BILITOT 0.6  PROT 6.1*  ALBUMIN 3.4*   Recent Labs    01/16/17 0450  WBC 5.7  NEUTROABS 3.5  HGB 13.1  HCT 38.5  MCV 89.7  PLT 261   Lab Results  Component Value Date   TSH 2.950 01/16/2017   No results found for: HGBA1C Lab Results  Component Value Date   CHOL 253 (H) 01/05/2015   HDL 60 01/05/2015   LDLCALC 165 (H) 01/05/2015   LDLDIRECT 110.8 02/15/2011   TRIG 142 01/05/2015   CHOLHDL 4.2 01/05/2015    Significant Diagnostic Results in last 30 days:  No results  found.  Assessment/Plan Emily Summers was seen today for medical management of chronic issues.  Diagnoses and all orders for this visit:  Allergic rhinitis, unspecified seasonality, unspecified trigger  Osteoarthritis, unspecified osteoarthritis type, unspecified site  Wandering in diseases classified elsewhere   Above listed conditions stable  Continue current medication regimen  Continue to encourage pt interaction with other residents and participation in activities  Safety Precautions   Fall precautions  Redirect as needed  Family/ staff Communication:   Total Time:  Documentation:  Face to Face:  Family/Phone:   Labs/tests ordered:  Not doe  Medication list reviewed and assessed for continued appropriateness. Monthly medication orders reviewed and signed.  Vikki Ports, NP-C [facility]iatrics [facility] [phone], [place] [address] Cell Phone (Mon-Fri 8am-5pm):  [phone] On Call:  [phone] & follow prompts after 5pm & weekends Office Phone:  [phone] Office Fax:  [phone]

## 2017-07-02 NOTE — Assessment & Plan Note (Signed)
Stable. No recent complaints of worsening symptoms. Symptoms controlled with Tylenol 650 mg po TID and Tramadol 50 mg po BID

## 2017-07-16 ENCOUNTER — Encounter
Admission: RE | Admit: 2017-07-16 | Discharge: 2017-07-16 | Disposition: A | Discharge status: Home / Self Care | Payer: Medicare Other | Source: Ambulatory Visit | Attending: Internal Medicine | Admitting: Internal Medicine

## 2017-07-23 ENCOUNTER — Other Ambulatory Visit: Payer: Self-pay

## 2017-07-23 MED ORDER — TRAMADOL HCL 50 MG PO TABS: 50.0000 mg | ORAL_TABLET | Freq: Two times a day (BID) | ORAL | 2 refills | Status: DC

## 2017-07-23 NOTE — Telephone Encounter (Signed)
Rx sent to [facility] phone : [phone] , fax : [phone]  

## 2017-08-12 DIAGNOSIS — I10 Essential (primary) hypertension: Secondary | ICD-10-CM | POA: Diagnosis not present

## 2017-08-12 DIAGNOSIS — F325 Major depressive disorder, single episode, in full remission: Secondary | ICD-10-CM | POA: Diagnosis not present

## 2017-08-12 DIAGNOSIS — F0281 Dementia in other diseases classified elsewhere with behavioral disturbance: Secondary | ICD-10-CM | POA: Diagnosis not present

## 2017-08-12 DIAGNOSIS — G301 Alzheimer's disease with late onset: Secondary | ICD-10-CM | POA: Diagnosis not present

## 2017-08-16 ENCOUNTER — Encounter
Admission: RE | Admit: 2017-08-16 | Discharge: 2017-08-16 | Disposition: A | Discharge status: Home / Self Care | Payer: Medicare Other | Source: Ambulatory Visit | Attending: Internal Medicine | Admitting: Internal Medicine

## 2017-09-03 ENCOUNTER — Non-Acute Institutional Stay: Payer: Medicare Other | Admitting: Gerontology

## 2017-09-03 ENCOUNTER — Encounter: Payer: Self-pay | Admitting: Gerontology

## 2017-09-03 DIAGNOSIS — F329 Major depressive disorder, single episode, unspecified: Secondary | ICD-10-CM | POA: Diagnosis not present

## 2017-09-03 DIAGNOSIS — Z853 Personal history of malignant neoplasm of breast: Secondary | ICD-10-CM | POA: Diagnosis not present

## 2017-09-03 DIAGNOSIS — Z9183 Wandering in diseases classified elsewhere: Secondary | ICD-10-CM | POA: Diagnosis not present

## 2017-09-15 ENCOUNTER — Encounter
Admission: RE | Admit: 2017-09-15 | Discharge: 2017-09-15 | Disposition: A | Discharge status: Home / Self Care | Payer: Medicare Other | Source: Ambulatory Visit | Attending: Internal Medicine | Admitting: Internal Medicine

## 2017-09-15 NOTE — Progress Notes (Signed)
Location:    Nursing Home Room Number: 255 Place of Service:  ALF [phone]) Provider:  Toni Arthurs, NP-C  Toni Arthurs, NP  Patient Care Team: Toni Arthurs, NP as PCP - General (Family Medicine)  Extended Emergency Contact Information Primary Emergency Contact: Estelle Grumbles [place] of [place] Phone: [phone] Relation: Friend Secondary Emergency Contact: SARFATI,ELIZABETH Address: [place] Phone: [phone] Relation: None  Code Status:  FULL Goals of care: Advanced Directive information Advanced Directives 09/03/2017  Does Patient Have a Medical Advance Directive? No     Chief Complaint  Patient presents with  . Medical Management of Chronic Issues    Routine Visit    HPI:  Pt is a 84 y.o. female seen today for medical management of chronic diseases.    Major depression, single episode Stable. No s/s of depressive sx. On Zoloft 100 mg po Q Day  Personal history of malignant neoplasm of breast Stable. No current treatment. No f/u  Wandering in diseases classified elsewhere Stable. Pt is resident in memory care unit. No negative behaviors.   Please note pt with limited verbal/cognitive ability. Unable to obtain complete ROS. Some ROS info obtained from staff and documentation.   Past Medical History:  Diagnosis Date  . Alzheimer's dementia with behavioral disturbance   . Breast cancer ([place])   . Hypertension    Past Surgical History:  Procedure Laterality Date  . ABDOMINAL HYSTERECTOMY     Still have ovaries  . BREAST BIOPSY Left 1985  . BREAST RECONSTRUCTION Left   . MASTECTOMY     Right  . TONSILLECTOMY  1942    Allergies  Allergen Reactions  . Sulfa Antibiotics     Allergies as of 09/03/2017      Reactions   Sulfa Antibiotics       Medication List        Accurate as of 09/03/17 11:59 PM. Always use your most recent med list.          acetaminophen 325 MG tablet Commonly known as:  TYLENOL Take  650 mg by mouth 3 (three) times daily.   amLODipine 2.5 MG tablet Commonly known as:  NORVASC Take 1 tablet by mouth daily.   CEROVITE SENIOR Tabs Take 1 tablet by mouth daily.   Cholecalciferol 2000 units Caps Take 1 capsule by mouth daily.   hydrochlorothiazide 25 MG tablet Commonly known as:  HYDRODIURIL Take 25 mg by mouth daily.   LORazepam 0.5 MG tablet Commonly known as:  ATIVAN Take 1 tablet (0.5 mg total) by mouth daily. agitation that starts at around 2:00; OK to hold for sedation   memantine 5 MG tablet Commonly known as:  NAMENDA Take 5 mg by mouth 2 (two) times daily.   potassium chloride SA 20 MEQ tablet Commonly known as:  K-DUR,KLOR-CON Take 20 mEq by mouth 2 (two) times daily.   sennosides-docusate sodium 8.6-50 MG tablet Commonly known as:  SENOKOT-S Take 2 tablets by mouth 2 (two) times daily.   sertraline 100 MG tablet Commonly known as:  ZOLOFT Take 100 mg by mouth daily.   traMADol 50 MG tablet Commonly known as:  ULTRAM Take 1 tablet (50 mg total) by mouth 2 (two) times daily.   traZODone 50 MG tablet Commonly known as:  DESYREL Take 50 mg by mouth at bedtime.       Review of Systems  Unable to perform ROS: Dementia  Constitutional: Negative for activity change,  appetite change, chills, diaphoresis and fever.  HENT: Negative for congestion, mouth sores, nosebleeds, postnasal drip, sneezing, sore throat, trouble swallowing and voice change.   Respiratory: Negative for apnea, cough, choking, chest tightness, shortness of breath and wheezing.   Cardiovascular: Negative for chest pain, palpitations and leg swelling.  Gastrointestinal: Negative for abdominal distention, abdominal pain, constipation, diarrhea and nausea.  Genitourinary: Negative for difficulty urinating, dysuria, frequency and urgency.  Musculoskeletal: Negative for back pain, gait problem and myalgias. Arthralgias: typical arthritis.  Skin: Negative for color change, pallor,  rash and wound.  Neurological: Positive for weakness. Negative for dizziness, tremors, syncope, speech difficulty, numbness and headaches.  Psychiatric/Behavioral: Positive for confusion. Negative for agitation and behavioral problems.  All other systems reviewed and are negative.   Immunization History  Administered Date(s) Administered  . Influenza Split 01/15/2011  . Influenza-Unspecified 12/16/2012, 12/27/2015, 12/10/2016  . PPD Test 04/12/2016  . Pneumococcal Conjugate-13 06/28/2014  . Pneumococcal Polysaccharide-23 05/05/2012  . Zoster 01/20/2006   Pertinent  Health Maintenance Due  Topic Date Due  . INFLUENZA VACCINE  10/16/2017  . DEXA SCAN  Completed  . PNA vac Low Risk Adult  Completed   No flowsheet data found. Functional Status Survey:    Vitals:   09/03/17 1133  BP: 131/72  Pulse: 78  Resp: 16  Temp: (!) 96.6 F (35.9 C)  TempSrc: Oral  SpO2: 96%  Weight: 150 lb 4.8 oz (68.2 kg)  Height: _0  (1.549 m)   Body mass index is 28.4 kg/m. Physical Exam  Constitutional: Vital signs are normal. She appears well-developed and well-nourished. She is active and cooperative. She does not appear ill. No distress.  HENT:  Head: Normocephalic and atraumatic.  Mouth/Throat: Uvula is midline, oropharynx is clear and moist and mucous membranes are normal. Mucous membranes are not pale, not dry and not cyanotic.  Eyes: Pupils are equal, round, and reactive to light. Conjunctivae, EOM and lids are normal.  Neck: Trachea normal, normal range of motion and full passive range of motion without pain. Neck supple. No JVD present. No tracheal deviation, no edema and no erythema present. No thyromegaly present.  Cardiovascular: Normal rate, regular rhythm, normal heart sounds, intact distal pulses and normal pulses. Exam reveals no gallop, no distant heart sounds and no friction rub.  No murmur heard. Pulses:      Dorsalis pedis pulses are 2+ on the right side, and 2+ on the left  side.  No edema  Pulmonary/Chest: Effort normal and breath sounds normal. No accessory muscle usage. No respiratory distress. She has no decreased breath sounds. She has no wheezes. She has no rhonchi. She has no rales. She exhibits no tenderness.  Abdominal: Soft. Normal appearance and bowel sounds are normal. She exhibits no distension and no ascites. There is no tenderness.  Musculoskeletal: Normal range of motion. She exhibits no edema or tenderness.  Expected osteoarthritis, stiffness; Bilateral Calves soft, supple. Negative Homan's Sign. B- pedal pulses equal; generalized weakness  Neurological: She is alert. She has normal strength. She is disoriented.  Content with babydolls and tearing magazines  Skin: Skin is warm, dry and intact. She is not diaphoretic. No cyanosis. No pallor. Nails show no clubbing.  Psychiatric: She has a normal mood and affect. Her speech is normal. Judgment normal. She is slowed. Cognition and memory are impaired. She exhibits abnormal recent memory and abnormal remote memory.  Nursing note and vitals reviewed.   Labs reviewed: Recent Labs    01/16/17 0450  NA  138  K 3.4*  CL 103  CO2 30  GLUCOSE 80  BUN 15  CREATININE 0.71  CALCIUM 8.9  MG 2.1   Recent Labs    01/16/17 0450  AST 22  ALT 13*  ALKPHOS 45  BILITOT 0.6  PROT 6.1*  ALBUMIN 3.4*   Recent Labs    01/16/17 0450  WBC 5.7  NEUTROABS 3.5  HGB 13.1  HCT 38.5  MCV 89.7  PLT 261   Lab Results  Component Value Date   TSH 2.950 01/16/2017   No results found for: HGBA1C Lab Results  Component Value Date   CHOL 253 (H) 01/05/2015   HDL 60 01/05/2015   LDLCALC 165 (H) 01/05/2015   LDLDIRECT 110.8 02/15/2011   TRIG 142 01/05/2015   CHOLHDL 4.2 01/05/2015    Significant Diagnostic Results in last 30 days:  No results found.  Assessment/Plan Emily Summers was seen today for medical management of chronic issues.  Diagnoses and all orders for this visit:  Major depressive  disorder with single episode, remission status unspecified  Personal history of malignant neoplasm of breast  Wandering in diseases classified elsewhere   Above listed conditions stable  Continue current medication regimen  Assist with ADLs as needed  Continue to encourage pt interaction with other residents and participation in activities  Monitor for worsening depression  Safety precautions  Fall precautions  Family/ staff Communication:   Total Time:  Documentation:  Face to Face:  Family/Phone:   Labs/tests ordered:  Cbc, met c, mag, tsh, B12, D  Medication list reviewed and assessed for continued appropriateness. Monthly medication orders reviewed and signed.  Vikki Ports, NP-C Geriatrics [facility] [phone], [place] [address] Cell Phone (Mon-Fri 8am-5pm):  [phone] On Call:  [phone] & follow prompts after 5pm & weekends Office Phone:  [phone] Office Fax:  [phone]

## 2017-09-15 NOTE — Assessment & Plan Note (Signed)
Stable. No s/s of depressive sx. On Zoloft 100 mg po Q Day

## 2017-09-15 NOTE — Assessment & Plan Note (Signed)
Stable. Pt is resident in memory care unit. No negative behaviors.

## 2017-09-15 NOTE — Assessment & Plan Note (Signed)
Stable. No current treatment. No f/u

## 2017-09-23 ENCOUNTER — Other Ambulatory Visit
Admission: RE | Admit: 2017-09-23 | Discharge: 2017-09-23 | Disposition: A | Discharge status: Home / Self Care | Payer: Medicare Other | Source: Other Acute Inpatient Hospital | Attending: Internal Medicine | Admitting: Internal Medicine

## 2017-09-23 DIAGNOSIS — G301 Alzheimer's disease with late onset: Secondary | ICD-10-CM | POA: Insufficient documentation

## 2017-09-23 LAB — TSH: TSH: 1.896 u[IU]/mL (ref 0.350–4.500)

## 2017-09-23 LAB — VITAMIN B12: VITAMIN B 12: 703 pg/mL (ref 180–914)

## 2017-09-23 LAB — COMPREHENSIVE METABOLIC PANEL
ALBUMIN: 3.5 g/dL (ref 3.5–5.0)
ALT: 15 U/L (ref 0–44)
ANION GAP: 5 (ref 5–15)
AST: 23 U/L (ref 15–41)
Alkaline Phosphatase: 45 U/L (ref 38–126)
BUN: 16 mg/dL (ref 8–23)
CHLORIDE: 102 mmol/L (ref 98–111)
CO2: 31 mmol/L (ref 22–32)
Calcium: 8.7 mg/dL — ABNORMAL LOW (ref 8.9–10.3)
Creatinine, Ser: 0.75 mg/dL (ref 0.44–1.00)
GFR calc Af Amer: >60 mL/min (ref >60)
GFR calc non Af Amer: >60 mL/min (ref >60)
GLUCOSE: 86 mg/dL (ref 70–99)
Potassium: 3.6 mmol/L (ref 3.5–5.1)
SODIUM: 138 mmol/L (ref 135–145)
Total Bilirubin: 0.6 mg/dL (ref 0.3–1.2)
Total Protein: 6.2 g/dL — ABNORMAL LOW (ref 6.5–8.1)

## 2017-09-23 LAB — CBC WITH DIFFERENTIAL/PLATELET
BASOS ABS: 0.1 10*3/uL (ref 0–0.1)
BASOS PCT: 1 %
EOS ABS: 0.2 10*3/uL (ref 0–0.7)
Eosinophils Relative: 4 %
HEMATOCRIT: 37.7 % (ref 35.0–47.0)
HEMOGLOBIN: 12.8 g/dL (ref 12.0–16.0)
Lymphocytes Relative: 25 %
Lymphs Abs: 1.4 10*3/uL (ref 1.0–3.6)
MCH: 31.2 pg (ref 26.0–34.0)
MCHC: 33.9 g/dL (ref 32.0–36.0)
MCV: 92 fL (ref 80.0–100.0)
MONOS PCT: 10 %
Monocytes Absolute: 0.6 10*3/uL (ref 0.2–0.9)
Neutro Abs: 3.5 10*3/uL (ref 1.4–6.5)
Neutrophils Relative %: 60 %
Platelets: 295 10*3/uL (ref 150–440)
RBC: 4.1 MIL/uL (ref 3.80–5.20)
RDW: 14.3 % (ref 11.5–14.5)
WBC: 5.8 10*3/uL (ref 3.6–11.0)

## 2017-09-23 LAB — MAGNESIUM: MAGNESIUM: 2.1 mg/dL (ref 1.7–2.4)

## 2017-09-24 LAB — VITAMIN D 25 HYDROXY (VIT D DEFICIENCY, FRACTURES): Vit D, 25-Hydroxy: 31.8 ng/mL (ref 30.0–100.0)

## 2017-10-16 ENCOUNTER — Encounter
Admission: RE | Admit: 2017-10-16 | Discharge: 2017-10-16 | Disposition: A | Discharge status: Home / Self Care | Payer: Medicare Other | Source: Ambulatory Visit | Attending: Internal Medicine | Admitting: Internal Medicine

## 2017-10-20 ENCOUNTER — Other Ambulatory Visit: Payer: Self-pay

## 2017-10-20 MED ORDER — TRAMADOL HCL 50 MG PO TABS: 50.0000 mg | ORAL_TABLET | Freq: Two times a day (BID) | ORAL | 0 refills | Status: DC

## 2017-10-20 NOTE — Telephone Encounter (Signed)
Rx sent to [facility] phone : [phone] , fax : [phone]  

## 2017-11-06 ENCOUNTER — Encounter: Payer: Self-pay | Admitting: Adult Health

## 2017-11-06 ENCOUNTER — Non-Acute Institutional Stay: Payer: Medicare Other | Admitting: Adult Health

## 2017-11-06 DIAGNOSIS — E876 Hypokalemia: Secondary | ICD-10-CM | POA: Diagnosis not present

## 2017-11-06 DIAGNOSIS — G4700 Insomnia, unspecified: Secondary | ICD-10-CM | POA: Diagnosis not present

## 2017-11-06 DIAGNOSIS — F0281 Dementia in other diseases classified elsewhere with behavioral disturbance: Secondary | ICD-10-CM | POA: Diagnosis not present

## 2017-11-06 DIAGNOSIS — F419 Anxiety disorder, unspecified: Secondary | ICD-10-CM

## 2017-11-06 DIAGNOSIS — G301 Alzheimer's disease with late onset: Secondary | ICD-10-CM | POA: Diagnosis not present

## 2017-11-06 DIAGNOSIS — F339 Major depressive disorder, recurrent, unspecified: Secondary | ICD-10-CM

## 2017-11-06 DIAGNOSIS — I10 Essential (primary) hypertension: Secondary | ICD-10-CM | POA: Diagnosis not present

## 2017-11-06 DIAGNOSIS — F02818 Dementia in other diseases classified elsewhere, unspecified severity, with other behavioral disturbance: Secondary | ICD-10-CM

## 2017-11-06 NOTE — Progress Notes (Signed)
[facility] Room Number: 850Y Place of Service:  ALF [phone]) Provider:  Durenda Age, NP  No care team member to display  Extended Emergency Contact Information Primary Emergency Contact: [place] Phone: [phone] Relation: Friend Secondary Emergency Contact: [facility] Address: [place] Phone: [phone] Relation: None  Code Status:  FULL  Goals of care: Advanced Directive information Advanced Directives 11/06/2017  Does Patient Have a Medical Advance Directive? No     Chief Complaint  Patient presents with  . Medical Management of Chronic Issues    Routine Visit    HPI:  Pt is a 84 y.o. female seen today for medical management of chronic diseases. She has PMH of alzheimer's dementia, breast cancer, and hypertension. She was seen by the nurses station today. She does not answer queries and mumbles incoherent words. She had a recent fall and was noted to have greenish bruising on her left face.    Past Medical History:  Diagnosis Date  . Alzheimer's dementia with behavioral disturbance   . Breast cancer ([place])   . Hypertension    Past Surgical History:  Procedure Laterality Date  . ABDOMINAL HYSTERECTOMY     Still have ovaries  . BREAST BIOPSY Left 1985  . BREAST RECONSTRUCTION Left   . MASTECTOMY     Right  . TONSILLECTOMY  1942    Allergies  Allergen Reactions  . Sulfa Antibiotics     Outpatient Encounter Medications as of 11/06/2017  Medication Sig  . acetaminophen (TYLENOL) 325 MG tablet Take 650 mg by mouth 3 (three) times daily.  Marland Kitchen amLODipine (NORVASC) 2.5 MG tablet Take 1 tablet by mouth daily.   . Cholecalciferol 2000 units CAPS Take 1 capsule by mouth daily.  . hydrochlorothiazide (HYDRODIURIL) 25 MG tablet Take 25 mg by mouth daily.  Marland Kitchen LORazepam (ATIVAN) 0.5 MG tablet Take 1 tablet (0.5 mg total) by mouth daily. agitation that starts  at around 2:00; OK to hold for sedation  . memantine (NAMENDA) 5 MG tablet Take 5 mg by mouth 2 (two) times daily.  . Multiple Vitamins-Minerals (CEROVITE SENIOR) TABS Take 1 tablet by mouth daily.  . NON FORMULARY Diet: Regular  . potassium chloride SA (K-DUR,KLOR-CON) 20 MEQ tablet Take 20 mEq by mouth 2 (two) times daily.  . sennosides-docusate sodium (SENOKOT-S) 8.6-50 MG tablet Take 2 tablets by mouth 2 (two) times daily.   . sertraline (ZOLOFT) 100 MG tablet Take 100 mg by mouth daily.  . traMADol (ULTRAM) 50 MG tablet Take 1 tablet (50 mg total) by mouth 2 (two) times daily.  . traZODone (DESYREL) 50 MG tablet Take 50 mg by mouth at bedtime.    No facility-administered encounter medications on file as of 11/06/2017.     Review of Systems  Unable to obtain due to alzheimer's dementia    Immunization History  Administered Date(s) Administered  . Influenza Split 01/15/2011  . Influenza-Unspecified 12/16/2012, 12/27/2015, 12/10/2016  . PPD Test 04/12/2016  . Pneumococcal Conjugate-13 06/28/2014  . Pneumococcal Polysaccharide-23 05/05/2012  . Zoster 01/20/2006   Pertinent  Health Maintenance Due  Topic Date Due  . INFLUENZA VACCINE  10/16/2017  . DEXA SCAN  Completed  . PNA vac Low Risk Adult  Completed   No flowsheet data found. Functional Status Survey:    Vitals:   11/06/17 1150  BP: (!) 149/60  Pulse: 71  Resp: 18  Temp: (!)  97.1 F (36.2 C)  TempSrc: Oral  SpO2: 100%  Weight: 151 lb 14.4 oz (68.9 kg)  Height: 5\' 1"  (1.549 m)   Body mass index is 28.7 kg/m.  Physical Exam  GENERAL APPEARANCE: Well nourished. In no acute distress. Normal body habitus SKIN:  Greenish bruising on her left side of face MOUTH and THROAT: Lips are without lesions. Oral mucosa is moist and without lesions.  RESPIRATORY: Breathing is even & unlabored, BS CTAB CARDIAC: RRR, no murmur,no extra heart sounds, no edema GI: Abdomen soft, normal BS, no masses, no  tenderness EXTREMITIES:  Able to move X 4 extremities PSYCHIATRIC: Disoriented X 3. Affect and behavior are appropriate   Labs reviewed: Recent Labs    01/16/17 0450 09/23/17 0400  NA 138 138  K 3.4* 3.6  CL 103 102  CO2 30 31  GLUCOSE 80 86  BUN 15 16  CREATININE 0.71 0.75  CALCIUM 8.9 8.7*  MG 2.1 2.1   Recent Labs    01/16/17 0450 09/23/17 0400  AST 22 23  ALT 13* 15  ALKPHOS 45 45  BILITOT 0.6 0.6  PROT 6.1* 6.2*  ALBUMIN 3.4* 3.5   Recent Labs    01/16/17 0450 09/23/17 0400  WBC 5.7 5.8  NEUTROABS 3.5 3.5  HGB 13.1 12.8  HCT 38.5 37.7  MCV 89.7 92.0  PLT 261 295   Lab Results  Component Value Date   TSH 1.896 09/23/2017    Lab Results  Component Value Date   CHOL 253 (H) 01/05/2015   HDL 60 01/05/2015   LDLCALC 165 (H) 01/05/2015   LDLDIRECT 110.8 02/15/2011   TRIG 142 01/05/2015   CHOLHDL 4.2 01/05/2015     Assessment/Plan  1. Essential hypertension - well-controlled, continue hydrochlorothiazide 25 mg 1 tab daily and amlodipine 2.5 mg1 tab daily   2. Hypokalemia -continue KCl 20 meq 1 tab twice a day Lab Results  Component Value Date   K 3.6 09/23/2017     3. Depression, recurrent ([place]) - continue sertraline 100 mg 1 tab daily   4. Anxiety - mood is stable, continue Ativan 0.5 mg 1 tabdaily at 2 PM   5. Insomnia, unspecified type - continue trazodone 50 mg 1 tab daily at bedtime   6. Late onset Alzheimer's disease with behavioral disturbance - she had a recent fall,  fall precaution and continue supportive care    Family/ staff Communication: Discussed plan of care with charge nurse.  Labs/tests ordered:  None  Goals of care:   Long-term care   Durenda Age, NP [facility] and Adult Medicine [phone] (Monday-Friday 8:00 a.m. - 5:00 p.m.) [phone] (after hours)

## 2017-11-16 ENCOUNTER — Encounter
Admission: RE | Admit: 2017-11-16 | Discharge: 2017-11-16 | Disposition: A | Discharge status: Home / Self Care | Payer: Medicare Other | Source: Ambulatory Visit | Attending: Internal Medicine | Admitting: Internal Medicine

## 2017-11-18 ENCOUNTER — Other Ambulatory Visit: Payer: Self-pay

## 2017-11-18 MED ORDER — TRAMADOL HCL 50 MG PO TABS: 50.0000 mg | ORAL_TABLET | Freq: Two times a day (BID) | ORAL | 0 refills | Status: DC

## 2017-11-18 NOTE — Telephone Encounter (Signed)
Rx sent to [facility] phone : [phone] , fax : [phone]  

## 2017-12-05 DIAGNOSIS — F325 Major depressive disorder, single episode, in full remission: Secondary | ICD-10-CM | POA: Diagnosis not present

## 2017-12-05 DIAGNOSIS — I10 Essential (primary) hypertension: Secondary | ICD-10-CM | POA: Diagnosis not present

## 2017-12-05 DIAGNOSIS — Z593 Problems related to living in residential institution: Secondary | ICD-10-CM | POA: Diagnosis not present

## 2017-12-05 DIAGNOSIS — G301 Alzheimer's disease with late onset: Secondary | ICD-10-CM | POA: Diagnosis not present

## 2017-12-05 DIAGNOSIS — F0281 Dementia in other diseases classified elsewhere with behavioral disturbance: Secondary | ICD-10-CM | POA: Diagnosis not present

## 2017-12-16 ENCOUNTER — Encounter
Admission: RE | Admit: 2017-12-16 | Discharge: 2017-12-16 | Disposition: A | Discharge status: Home / Self Care | Payer: Medicare Other | Source: Ambulatory Visit | Attending: Internal Medicine | Admitting: Internal Medicine

## 2017-12-19 ENCOUNTER — Other Ambulatory Visit: Payer: Self-pay

## 2017-12-19 MED ORDER — TRAMADOL HCL 50 MG PO TABS: 50.0000 mg | ORAL_TABLET | Freq: Two times a day (BID) | ORAL | 0 refills | Status: DC

## 2017-12-19 NOTE — Telephone Encounter (Signed)
Refill form completed and faxed to [facility] [phone]

## 2017-12-25 ENCOUNTER — Other Ambulatory Visit: Payer: Self-pay

## 2017-12-25 MED ORDER — LORAZEPAM 0.5 MG PO TABS: 0.5000 mg | ORAL_TABLET | Freq: Every day | ORAL | 0 refills | Status: DC

## 2017-12-25 NOTE — Telephone Encounter (Signed)
Rx sent to [facility] phone : [phone] , fax : [phone]  

## 2018-01-07 ENCOUNTER — Encounter: Payer: Self-pay | Admitting: Adult Health

## 2018-01-07 ENCOUNTER — Non-Acute Institutional Stay: Payer: Medicare Other | Admitting: Adult Health

## 2018-01-07 DIAGNOSIS — M150 Primary generalized (osteo)arthritis: Secondary | ICD-10-CM | POA: Diagnosis not present

## 2018-01-07 DIAGNOSIS — E876 Hypokalemia: Secondary | ICD-10-CM

## 2018-01-07 DIAGNOSIS — M159 Polyosteoarthritis, unspecified: Secondary | ICD-10-CM

## 2018-01-07 DIAGNOSIS — I10 Essential (primary) hypertension: Secondary | ICD-10-CM | POA: Diagnosis not present

## 2018-01-07 DIAGNOSIS — G301 Alzheimer's disease with late onset: Secondary | ICD-10-CM | POA: Diagnosis not present

## 2018-01-07 DIAGNOSIS — F0281 Dementia in other diseases classified elsewhere with behavioral disturbance: Secondary | ICD-10-CM | POA: Diagnosis not present

## 2018-01-07 DIAGNOSIS — F02818 Dementia in other diseases classified elsewhere, unspecified severity, with other behavioral disturbance: Secondary | ICD-10-CM

## 2018-01-07 DIAGNOSIS — K5909 Other constipation: Secondary | ICD-10-CM

## 2018-01-07 DIAGNOSIS — F329 Major depressive disorder, single episode, unspecified: Secondary | ICD-10-CM

## 2018-01-07 NOTE — Progress Notes (Signed)
[facility]   [facility] Room Number: [place] of Service:  ALF (13)   CODE STATUS: Full Code  Allergies  Allergen Reactions  . Sulfa Antibiotics     Chief Complaint  Patient presents with  . Medical Management of Chronic Issues    Benign essential hypertension; late onset alzheimer's disease with behavioral disturbance; chronic constipation.     HPI:  She is a 84 year old long term resident of assisted living being seen for the management of her chronic illnesses; hypertension; alzheimer's disease constipation. She is unable to participate in the hpi or ros. There are no reports of changes in appetite; no indications of agitation; no uncontrolled pain.   Past Medical History:  Diagnosis Date  . Alzheimer's dementia with behavioral disturbance ([place])   . Breast cancer ([place])   . Hypertension     Past Surgical History:  Procedure Laterality Date  . ABDOMINAL HYSTERECTOMY     Still have ovaries  . BREAST BIOPSY Left 1985  . BREAST RECONSTRUCTION Left   . MASTECTOMY     Right  . TONSILLECTOMY  1942    Social History   Socioeconomic History  . Marital status: Widowed    Spouse name: Not on file  . Number of children: 0  . Years of education: Not on file  . Highest education level: Not on file  Occupational History  . Not on file  Social Needs  . Financial resource strain: Not on file  . Food insecurity:    Worry: Not on file    Inability: Not on file  . Transportation needs:    Medical: Not on file    Non-medical: Not on file  Tobacco Use  . Smoking status: Never Smoker  . Smokeless tobacco: Never Used  Substance and Sexual Activity  . Alcohol use: Yes    Comment: Occasional  . Drug use: No  . Sexual activity: Not on file  Lifestyle  . Physical activity:    Days per week: Not on file    Minutes per session: Not on file  . Stress: Not on file  Relationships  . Social connections:    Talks on phone: Not on file    Gets  together: Not on file    Attends religious service: Not on file    Active member of club or organization: Not on file    Attends meetings of clubs or organizations: Not on file    Relationship status: Not on file  . Intimate partner violence:    Fear of current or ex partner: Not on file    Emotionally abused: Not on file    Physically abused: Not on file    Forced sexual activity: Not on file  Other Topics Concern  . Not on file  Social History Narrative   From [place].   History reviewed. No pertinent family history.    VITAL SIGNS BP 128/64   Pulse 68   Temp (!) 97.5 F (36.4 C)   Resp 16   Ht 5\' 1"  (1.549 m)   Wt 154 lb 9.6 oz (70.1 kg)   SpO2 95%   BMI 29.21 kg/m   Outpatient Encounter Medications as of 01/07/2018  Medication Sig  . acetaminophen (TYLENOL) 325 MG tablet Take 650 mg by mouth 3 (three) times daily.  Marland Kitchen amLODipine (NORVASC) 2.5 MG tablet Take 1 tablet by mouth daily.   . Cholecalciferol 2000 units CAPS Take 1 capsule by mouth daily.  Marland Kitchen  hydrochlorothiazide (HYDRODIURIL) 25 MG tablet Take 25 mg by mouth daily.  Marland Kitchen LORazepam (ATIVAN) 0.5 MG tablet Take 1 tablet (0.5 mg total) by mouth daily. agitation that starts at around 2:00; OK to hold for sedation  . memantine (NAMENDA) 5 MG tablet Take 5 mg by mouth 2 (two) times daily.  . Multiple Vitamins-Minerals (CEROVITE SENIOR) TABS Take 1 tablet by mouth daily.  . NON FORMULARY Diet: Regular  . potassium chloride SA (K-DUR,KLOR-CON) 20 MEQ tablet Take 20 mEq by mouth 2 (two) times daily.  . sennosides-docusate sodium (SENOKOT-S) 8.6-50 MG tablet Take 2 tablets by mouth 2 (two) times daily.   . sertraline (ZOLOFT) 100 MG tablet Take 100 mg by mouth daily.  . traMADol (ULTRAM) 50 MG tablet Take 1 tablet (50 mg total) by mouth 2 (two) times daily.  . traZODone (DESYREL) 50 MG tablet Take 50 mg by mouth at bedtime.    No facility-administered encounter medications on file as of 01/07/2018.      SIGNIFICANT  DIAGNOSTIC EXAMS  LABS REVIEWED:   09-23-17: wbc 5.8; hgb 12.8; hct 37.7; mcv 92.0 plt 295; glucose 86; bun 16; creat 0.75 k+ 3.6; na++138; ca 8.7; liver normal albumin 3.5 mag 2.1 tsh 1.896; vit B 12: 703; vit D 31.8  Review of Systems  Unable to perform ROS: Dementia (confusion )    Physical Exam  Constitutional: She appears well-developed and well-nourished. No distress.  Neck: No thyromegaly present.  Cardiovascular: Normal rate, regular rhythm, normal heart sounds and intact distal pulses.  Pulmonary/Chest: Effort normal and breath sounds normal. No respiratory distress.  Status post right mastectomy   Abdominal: Soft. Bowel sounds are normal. She exhibits no distension. There is no tenderness.  Musculoskeletal: Normal range of motion. She exhibits no edema.  Lymphadenopathy:    She has no cervical adenopathy.  Neurological: She is alert.  Skin: Skin is warm and dry. She is not diaphoretic.  Psychiatric: She has a normal mood and affect.     ASSESSMENT/ PLAN:  TODAY:   1. Benign essential hypertension: stable b/p 128/64 will continue norvasc 2.5 mg daily and hctz 25 mg daily   2. Late onset alzheimer's disease with behavioral disturbance: is without change weight is 154 pounds; will continue namenda  5 mg twice daily   3.  Chronic constipation is stable will continue senna s 2 tabs twice daily   4. Major depressive disorder with single episode, remission status unspecified: is emotionally stable will continue zoloft 100 mg daily and ativan daily at 2 pm for anxiety. Is taking trazodone 50 mg nightly  5.  Chronic hypokalemia: is stable k+ 3.6 will continue k+ 20 meq twice daily   6. Primary osteoarthritis involving multiple joints: is stable will continue tylenol 650 mg three times daily and ultram 50 mg twice daily        MD is aware of resident's narcotic use and is in agreement with current plan of care. We will attempt to wean resident as apropriate   [name] NP [facility]  Contact [phone] Monday through Friday 8am- 5pm  After hours call [phone]

## 2018-01-12 DIAGNOSIS — I10 Essential (primary) hypertension: Secondary | ICD-10-CM | POA: Insufficient documentation

## 2018-01-12 DIAGNOSIS — K5909 Other constipation: Secondary | ICD-10-CM | POA: Insufficient documentation

## 2018-01-12 DIAGNOSIS — E876 Hypokalemia: Secondary | ICD-10-CM | POA: Insufficient documentation

## 2018-01-13 ENCOUNTER — Non-Acute Institutional Stay: Payer: Medicare Other | Admitting: Adult Health

## 2018-01-13 ENCOUNTER — Encounter: Payer: Self-pay | Admitting: Adult Health

## 2018-01-13 DIAGNOSIS — J309 Allergic rhinitis, unspecified: Secondary | ICD-10-CM | POA: Diagnosis not present

## 2018-01-13 NOTE — Progress Notes (Signed)
[facility]   [facility] Room Number: [place] of Service:  ALF (13)   CODE STATUS: Full Code  Allergies  Allergen Reactions  . Sulfa Antibiotics     Chief Complaint  Patient presents with  . Acute Visit    Cough / Congestion    HPI:  Per staff for the past 1-2 days has had a cough runny nose and sinus congestion. There are no reports of fevers no changes in appetite.   Past Medical History:  Diagnosis Date  . Alzheimer's dementia with behavioral disturbance ([place])   . Breast cancer ([name])   . Hypertension     Past Surgical History:  Procedure Laterality Date  . ABDOMINAL HYSTERECTOMY     Still have ovaries  . BREAST BIOPSY Left 1985  . BREAST RECONSTRUCTION Left   . MASTECTOMY     Right  . TONSILLECTOMY  1942    Social History   Socioeconomic History  . Marital status: Widowed    Spouse name: Not on file  . Number of children: 0  . Years of education: Not on file  . Highest education level: Not on file  Occupational History  . Not on file  Social Needs  . Financial resource strain: Not on file  . Food insecurity:    Worry: Not on file    Inability: Not on file  . Transportation needs:    Medical: Not on file    Non-medical: Not on file  Tobacco Use  . Smoking status: Never Smoker  . Smokeless tobacco: Never Used  Substance and Sexual Activity  . Alcohol use: Yes    Comment: Occasional  . Drug use: No  . Sexual activity: Not on file  Lifestyle  . Physical activity:    Days per week: Not on file    Minutes per session: Not on file  . Stress: Not on file  Relationships  . Social connections:    Talks on phone: Not on file    Gets together: Not on file    Attends religious service: Not on file    Active member of club or organization: Not on file    Attends meetings of clubs or organizations: Not on file    Relationship status: Not on file  . Intimate partner violence:    Fear of current or ex partner: Not on  file    Emotionally abused: Not on file    Physically abused: Not on file    Forced sexual activity: Not on file  Other Topics Concern  . Not on file  Social History Narrative   From [place].   History reviewed. No pertinent family history.    VITAL SIGNS BP 132/76   Pulse 78   Temp 97.7 F (36.5 C)   Resp 18   Ht 5\' 1"  (1.549 m)   Wt 154 lb 9.6 oz (70.1 kg)   SpO2 100%   BMI 29.21 kg/m   Outpatient Encounter Medications as of 01/13/2018  Medication Sig  . acetaminophen (TYLENOL) 325 MG tablet Take 650 mg by mouth 3 (three) times daily.  Marland Kitchen amLODipine (NORVASC) 2.5 MG tablet Take 1 tablet by mouth daily.   . Cholecalciferol 2000 units CAPS Take 1 capsule by mouth daily.  . hydrochlorothiazide (HYDRODIURIL) 25 MG tablet Take 25 mg by mouth daily.  Marland Kitchen LORazepam (ATIVAN) 0.5 MG tablet Take 1 tablet (0.5 mg total) by mouth daily. agitation that starts at around 2:00; OK to hold for  sedation  . memantine (NAMENDA) 5 MG tablet Take 5 mg by mouth 2 (two) times daily.  . Multiple Vitamins-Minerals (CEROVITE SENIOR) TABS Take 1 tablet by mouth daily.  . NON FORMULARY Diet: Regular  . potassium chloride SA (K-DUR,KLOR-CON) 20 MEQ tablet Take 20 mEq by mouth 2 (two) times daily.  . sennosides-docusate sodium (SENOKOT-S) 8.6-50 MG tablet Take 2 tablets by mouth 2 (two) times daily.   . sertraline (ZOLOFT) 100 MG tablet Take 100 mg by mouth daily.  . traMADol (ULTRAM) 50 MG tablet Take 1 tablet (50 mg total) by mouth 2 (two) times daily.  . traZODone (DESYREL) 50 MG tablet Take 50 mg by mouth at bedtime.    No facility-administered encounter medications on file as of 01/13/2018.      SIGNIFICANT DIAGNOSTIC EXAMS  LABS REVIEWED: PREVIOUS  09-23-17: wbc 5.8; hgb 12.8; hct 37.7; mcv 92.0 plt 295; glucose 86; bun 16; creat 0.75 k+ 3.6; na++138; ca 8.7; liver normal albumin 3.5 mag 2.1 tsh 1.896; vit B 12: 703; vit D 31.8  NO NEW LABS.    Review of Systems  Unable to perform ROS: Dementia  (confusion )   Physical Exam  Constitutional: She appears well-developed and well-nourished. No distress.  HENT:  Nose: Nose normal.  Mouth/Throat: Oropharynx is clear and moist.  Neck: No thyromegaly present.  Cardiovascular: Normal rate, regular rhythm, normal heart sounds and intact distal pulses.  Pulmonary/Chest: Effort normal and breath sounds normal. No respiratory distress.  Status post right mastectomy   Abdominal: Soft. Bowel sounds are normal. She exhibits no distension. There is no tenderness.  Musculoskeletal: Normal range of motion. She exhibits no edema.  Lymphadenopathy:    She has no cervical adenopathy.  Neurological: She is alert.  Skin: Skin is warm and dry. She is not diaphoretic.  Psychiatric: She has a normal mood and affect.       ASSESSMENT/ PLAN:  TODAY:   1.  Allergic rhinitis: is worse will begin flonase nightly through 01-23-18 and will monitor   MD is aware of resident's narcotic use and is in agreement with current plan of care. We will attempt to wean resident as apropriate   [name] NP [facility]  Contact [phone] Monday through Friday 8am- 5pm  After hours call [phone]

## 2018-01-16 ENCOUNTER — Encounter
Admission: RE | Admit: 2018-01-16 | Discharge: 2018-01-16 | Disposition: A | Discharge status: Home / Self Care | Payer: Medicare Other | Source: Ambulatory Visit | Attending: Internal Medicine | Admitting: Internal Medicine

## 2018-01-27 DIAGNOSIS — M25552 Pain in left hip: Secondary | ICD-10-CM | POA: Diagnosis not present

## 2018-02-17 ENCOUNTER — Other Ambulatory Visit: Payer: Self-pay | Admitting: Adult Health

## 2018-02-17 MED ORDER — TRAMADOL HCL 50 MG PO TABS: 50.0000 mg | ORAL_TABLET | Freq: Two times a day (BID) | ORAL | 0 refills | Status: DC

## 2018-03-06 ENCOUNTER — Non-Acute Institutional Stay: Payer: Medicare Other | Admitting: Adult Health

## 2018-03-06 ENCOUNTER — Encounter: Payer: Self-pay | Admitting: Adult Health

## 2018-03-06 DIAGNOSIS — K5909 Other constipation: Secondary | ICD-10-CM | POA: Diagnosis not present

## 2018-03-06 DIAGNOSIS — I10 Essential (primary) hypertension: Secondary | ICD-10-CM | POA: Diagnosis not present

## 2018-03-06 DIAGNOSIS — F02818 Dementia in other diseases classified elsewhere, unspecified severity, with other behavioral disturbance: Secondary | ICD-10-CM

## 2018-03-06 DIAGNOSIS — F0281 Dementia in other diseases classified elsewhere with behavioral disturbance: Secondary | ICD-10-CM

## 2018-03-06 DIAGNOSIS — G301 Alzheimer's disease with late onset: Secondary | ICD-10-CM | POA: Diagnosis not present

## 2018-03-06 NOTE — Progress Notes (Signed)
[facility]:   [facility] Room Number: [place] of Service:  ALF (13)   CODE STATUS: Full Code  Allergies  Allergen Reactions  . Sulfa Antibiotics     Chief Complaint  Patient presents with  . Medical Management of Chronic Issues    Benign essential hypertension; late onset alzheimer's disease with behavioral disturbance; chronic constipation.     HPI:  She is an 84 year old long term assisted living resident of this facility being seen for the management of her chronic illnesses: hypertension; alzheimer's disease and constipation. There are no reports of constipation; no uncontrolled pain; no reports of agitation.   Past Medical History:  Diagnosis Date  . Alzheimer's dementia with behavioral disturbance ([place])   . Breast cancer ([place])   . Hypertension     Past Surgical History:  Procedure Laterality Date  . ABDOMINAL HYSTERECTOMY     Still have ovaries  . BREAST BIOPSY Left 1985  . BREAST RECONSTRUCTION Left   . MASTECTOMY     Right  . TONSILLECTOMY  1942    Social History   Socioeconomic History  . Marital status: Widowed    Spouse name: Not on file  . Number of children: 0  . Years of education: Not on file  . Highest education level: Not on file  Occupational History  . Not on file  Social Needs  . Financial resource strain: Not on file  . Food insecurity:    Worry: Not on file    Inability: Not on file  . Transportation needs:    Medical: Not on file    Non-medical: Not on file  Tobacco Use  . Smoking status: Never Smoker  . Smokeless tobacco: Never Used  Substance and Sexual Activity  . Alcohol use: Yes    Comment: Occasional  . Drug use: No  . Sexual activity: Not on file  Lifestyle  . Physical activity:    Days per week: Not on file    Minutes per session: Not on file  . Stress: Not on file  Relationships  . Social connections:    Talks on phone: Not on file    Gets together: Not on file    Attends  religious service: Not on file    Active member of club or organization: Not on file    Attends meetings of clubs or organizations: Not on file    Relationship status: Not on file  . Intimate partner violence:    Fear of current or ex partner: Not on file    Emotionally abused: Not on file    Physically abused: Not on file    Forced sexual activity: Not on file  Other Topics Concern  . Not on file  Social History Narrative   From [place].   History reviewed. No pertinent family history.    VITAL SIGNS BP (!) 156/73   Pulse 81   Temp 97.7 F (36.5 C)   Resp 16   Ht 5\' 1"  (1.549 m)   Wt 146 lb 12.8 oz (66.6 kg)   SpO2 99%   BMI 27.74 kg/m   Outpatient Encounter Medications as of 03/06/2018  Medication Sig  . acetaminophen (TYLENOL) 325 MG tablet Take 650 mg by mouth 3 (three) times daily.  Marland Kitchen amLODipine (NORVASC) 2.5 MG tablet Take 1 tablet by mouth daily.   . Cholecalciferol 2000 units CAPS Take 1 capsule by mouth daily.  . hydrochlorothiazide (HYDRODIURIL) 25 MG tablet Take 25 mg  by mouth daily.  Marland Kitchen LORazepam (ATIVAN) 0.5 MG tablet Take 1 tablet (0.5 mg total) by mouth daily. agitation that starts at around 2:00; OK to hold for sedation  . memantine (NAMENDA) 5 MG tablet Take 5 mg by mouth 2 (two) times daily.  . Multiple Vitamins-Minerals (CEROVITE SENIOR) TABS Take 1 tablet by mouth daily.  . NON FORMULARY Diet: Regular  . omeprazole (PRILOSEC OTC) 20 MG tablet Take 20 mg by mouth daily.  . potassium chloride SA (K-DUR,KLOR-CON) 20 MEQ tablet Take 20 mEq by mouth 2 (two) times daily.  . sennosides-docusate sodium (SENOKOT-S) 8.6-50 MG tablet Take 2 tablets by mouth 2 (two) times daily.   . sertraline (ZOLOFT) 100 MG tablet Take 100 mg by mouth daily.  . traMADol (ULTRAM) 50 MG tablet Take 1 tablet (50 mg total) by mouth 2 (two) times daily.  . traZODone (DESYREL) 50 MG tablet Take 50 mg by mouth at bedtime.    No facility-administered encounter medications on file as of  03/06/2018.      SIGNIFICANT DIAGNOSTIC EXAMS  LABS REVIEWED: PREVIOUS  09-23-17: wbc 5.8; hgb 12.8; hct 37.7; mcv 92.0 plt 295; glucose 86; bun 16; creat 0.75 k+ 3.6; na++138; ca 8.7; liver normal albumin 3.5 mag 2.1 tsh 1.896; vit B 12: 703; vit D 31.8  NO NEW LABS.   Review of Systems  Unable to perform ROS: Dementia (confusion )   Physical Exam Constitutional:      General: She is not in acute distress.    Appearance: She is well-developed. She is not diaphoretic.  Neck:     Musculoskeletal: Neck supple.     Thyroid: No thyromegaly.  Cardiovascular:     Rate and Rhythm: Normal rate and regular rhythm.     Pulses: Normal pulses.     Heart sounds: Normal heart sounds.  Pulmonary:     Effort: Pulmonary effort is normal. No respiratory distress.     Breath sounds: Normal breath sounds.  Abdominal:     General: Bowel sounds are normal. There is no distension.     Palpations: Abdomen is soft.     Tenderness: There is no abdominal tenderness.  Musculoskeletal: Normal range of motion.     Right lower leg: No edema.     Left lower leg: No edema.     Comments: Status post right mastectomy    Lymphadenopathy:     Cervical: No cervical adenopathy.  Skin:    General: Skin is warm and dry.  Neurological:     Mental Status: She is alert. Mental status is at baseline.  Psychiatric:        Mood and Affect: Mood normal.      ASSESSMENT/ PLAN:  TODAY:   1. Benign essential hypertension: stable b/p 156/73 will continue norvasc 2.5 mg daily and hctz 25 mg daily   2. Late onset alzheimer's disease with behavioral disturbance: is without change weight is 146 (previous 154) pounds; will continue namenda  5 mg twice daily   3.  Chronic constipation is stable will continue senna s 2 tabs twice daily   PREVIOUS   4. Major depressive disorder with single episode, remission status unspecified: is emotionally stable will continue zoloft 100 mg daily and ativan daily at 2 pm for  anxiety. Is taking trazodone 50 mg nightly  5.  Chronic hypokalemia: is stable k+ 3.6 will continue k+ 20 meq twice daily   6. Primary osteoarthritis involving multiple joints: is stable will continue tylenol 650 mg three  times daily and ultram 50 mg twice daily    Will check cbc cmp    MD is aware of resident's narcotic use and is in agreement with current plan of care. We will attempt to wean resident as apropriate   [name] NP [facility]  Contact [phone] Monday through Friday 8am- 5pm  After hours call [phone]

## 2018-03-09 ENCOUNTER — Other Ambulatory Visit
Admission: RE | Admit: 2018-03-09 | Discharge: 2018-03-09 | Disposition: A | Discharge status: Home / Self Care | Payer: Medicare Other | Source: Ambulatory Visit | Attending: Adult Health | Admitting: Adult Health

## 2018-03-09 DIAGNOSIS — I10 Essential (primary) hypertension: Secondary | ICD-10-CM | POA: Diagnosis not present

## 2018-03-09 LAB — CBC
HCT: 44.9 % (ref 36.0–46.0)
HEMOGLOBIN: 13.9 g/dL (ref 12.0–15.0)
MCH: 30.2 pg (ref 26.0–34.0)
MCHC: 31 g/dL (ref 30.0–36.0)
MCV: 97.6 fL (ref 80.0–100.0)
Platelets: 337 10*3/uL (ref 150–400)
RBC: 4.6 MIL/uL (ref 3.87–5.11)
RDW: 13.5 % (ref 11.5–15.5)
WBC: 8.1 10*3/uL (ref 4.0–10.5)
nRBC: 0 % (ref 0.0–0.2)

## 2018-03-09 LAB — COMPREHENSIVE METABOLIC PANEL
ALBUMIN: 4 g/dL (ref 3.5–5.0)
ALK PHOS: 57 U/L (ref 38–126)
ALT: 15 U/L (ref 0–44)
AST: 26 U/L (ref 15–41)
Anion gap: 8 (ref 5–15)
BUN: 16 mg/dL (ref 8–23)
CALCIUM: 9.3 mg/dL (ref 8.9–10.3)
CO2: 28 mmol/L (ref 22–32)
CREATININE: 0.76 mg/dL (ref 0.44–1.00)
Chloride: 99 mmol/L (ref 98–111)
GFR calc Af Amer: >60 mL/min (ref >60)
GFR calc non Af Amer: >60 mL/min (ref >60)
GLUCOSE: 109 mg/dL — AB (ref 70–99)
Potassium: 3.5 mmol/L (ref 3.5–5.1)
SODIUM: 135 mmol/L (ref 135–145)
Total Bilirubin: 0.8 mg/dL (ref 0.3–1.2)
Total Protein: 7 g/dL (ref 6.5–8.1)

## 2018-03-17 ENCOUNTER — Other Ambulatory Visit: Payer: Self-pay | Admitting: Adult Health

## 2018-03-17 MED ORDER — TRAMADOL HCL 50 MG PO TABS: 50.0000 mg | ORAL_TABLET | Freq: Two times a day (BID) | ORAL | 0 refills | Status: DC

## 2018-03-30 ENCOUNTER — Encounter
Admission: RE | Admit: 2018-03-30 | Discharge: 2018-03-30 | Disposition: A | Discharge status: Home / Self Care | Payer: Medicare Other | Source: Ambulatory Visit | Attending: Internal Medicine | Admitting: Internal Medicine

## 2018-04-02 ENCOUNTER — Other Ambulatory Visit: Payer: Self-pay | Admitting: Adult Health

## 2018-04-02 MED ORDER — LORAZEPAM 0.5 MG PO TABS: 0.5000 mg | ORAL_TABLET | Freq: Every day | ORAL | 0 refills | Status: DC

## 2018-04-02 MED ORDER — LORAZEPAM 0.5 MG PO TABS: 0.5000 mg | ORAL_TABLET | ORAL | 0 refills | Status: AC | PRN

## 2018-04-17 ENCOUNTER — Other Ambulatory Visit: Payer: Self-pay | Admitting: Adult Health

## 2018-04-17 MED ORDER — TRAMADOL HCL 50 MG PO TABS: 50.0000 mg | ORAL_TABLET | Freq: Two times a day (BID) | ORAL | 0 refills | Status: DC

## 2018-04-18 ENCOUNTER — Inpatient Hospital Stay: Admission: RE | Admit: 2018-04-18 | Payer: Self-pay | Source: Ambulatory Visit | Admitting: Internal Medicine

## 2018-04-21 DIAGNOSIS — Z0000 Encounter for general adult medical examination without abnormal findings: Secondary | ICD-10-CM | POA: Diagnosis not present

## 2018-04-21 DIAGNOSIS — F0281 Dementia in other diseases classified elsewhere with behavioral disturbance: Secondary | ICD-10-CM | POA: Diagnosis not present

## 2018-04-21 DIAGNOSIS — Z593 Problems related to living in residential institution: Secondary | ICD-10-CM | POA: Diagnosis not present

## 2018-04-21 DIAGNOSIS — G301 Alzheimer's disease with late onset: Secondary | ICD-10-CM | POA: Diagnosis not present

## 2018-04-21 DIAGNOSIS — I10 Essential (primary) hypertension: Secondary | ICD-10-CM | POA: Diagnosis not present

## 2018-04-21 DIAGNOSIS — F325 Major depressive disorder, single episode, in full remission: Secondary | ICD-10-CM | POA: Diagnosis not present

## 2018-05-13 ENCOUNTER — Other Ambulatory Visit: Payer: Self-pay | Admitting: Adult Health

## 2018-05-13 MED ORDER — LORAZEPAM 0.5 MG PO TABS: 0.5000 mg | ORAL_TABLET | Freq: Every day | ORAL | 0 refills | Status: DC

## 2018-05-13 NOTE — Telephone Encounter (Signed)
Refill request pended and forwarded to Durenda Age, NP for approval and transmittal to [facility].

## 2018-05-15 ENCOUNTER — Encounter: Payer: Self-pay | Admitting: Adult Health

## 2018-05-15 ENCOUNTER — Non-Acute Institutional Stay: Payer: Medicare Other | Admitting: Adult Health

## 2018-05-15 ENCOUNTER — Other Ambulatory Visit: Payer: Self-pay | Admitting: Adult Health

## 2018-05-15 DIAGNOSIS — G301 Alzheimer's disease with late onset: Secondary | ICD-10-CM

## 2018-05-15 DIAGNOSIS — F419 Anxiety disorder, unspecified: Secondary | ICD-10-CM

## 2018-05-15 DIAGNOSIS — R2681 Unsteadiness on feet: Secondary | ICD-10-CM | POA: Diagnosis not present

## 2018-05-15 DIAGNOSIS — E876 Hypokalemia: Secondary | ICD-10-CM

## 2018-05-15 DIAGNOSIS — F329 Major depressive disorder, single episode, unspecified: Secondary | ICD-10-CM

## 2018-05-15 DIAGNOSIS — F0281 Dementia in other diseases classified elsewhere with behavioral disturbance: Secondary | ICD-10-CM

## 2018-05-15 DIAGNOSIS — I10 Essential (primary) hypertension: Secondary | ICD-10-CM | POA: Diagnosis not present

## 2018-05-15 DIAGNOSIS — G8929 Other chronic pain: Secondary | ICD-10-CM | POA: Diagnosis not present

## 2018-05-15 DIAGNOSIS — F02818 Dementia in other diseases classified elsewhere, unspecified severity, with other behavioral disturbance: Secondary | ICD-10-CM

## 2018-05-15 MED ORDER — TRAMADOL HCL 50 MG PO TABS: 50.0000 mg | ORAL_TABLET | Freq: Two times a day (BID) | ORAL | 0 refills | Status: DC

## 2018-05-15 NOTE — Telephone Encounter (Signed)
Refill request pended to Durenda Age, NP for approval and transmittal to [place].

## 2018-05-15 NOTE — Progress Notes (Signed)
[facility]:  [facility] Room Number: 255-P Place of Service:  ALF [id number]) Provider:  Durenda Age, NP  Patient Care Team: Kirk Ruths, MD as PCP - General (Internal Medicine) Gerlene Fee, NP as Nurse Practitioner (Geriatric Medicine)  Extended Emergency Contact Information Primary Emergency Contact: [place] Phone: [phone] Relation: Friend Secondary Emergency Contact: SARFATI,ELIZABETH Address: [place] Phone: [phone] Relation: None  Code Status:  Full Code  Goals of care: Advanced Directive information Advanced Directives 03/06/2018  Does Patient Have a Medical Advance Directive? No  Would patient like information on creating a medical advance directive? No - Patient declined     Chief Complaint  Patient presents with  . Medical Management of Chronic Issues    Routine ALF visit at [facility]    HPI:  Pt is a 85 y.o. female seen today for medical management of chronic diseases.  She is a long-term assisted living resident of [facility]. She has a PMH of late onset Alzheimer's disease with behavioral disturbance, HTN, and breast cancer. She was seen today. She is disoriented X 3. Noted to have left leg limp when walking with assistance. Noted that her bed is too high and charge nurse was aware of it and said that they have to ask permission from her sister.   Past Medical History:  Diagnosis Date  . Alzheimer's dementia with behavioral disturbance ([place])   . Breast cancer ([place])   . Hypertension    Past Surgical History:  Procedure Laterality Date  . ABDOMINAL HYSTERECTOMY     Still have ovaries  . BREAST BIOPSY Left 1985  . BREAST RECONSTRUCTION Left   . MASTECTOMY     Right  . TONSILLECTOMY  1942    Allergies  Allergen Reactions  . Sulfa Antibiotics     Outpatient Encounter Medications as of 05/15/2018  Medication Sig  . acetaminophen  (TYLENOL) 325 MG tablet Take 650 mg by mouth 3 (three) times daily.  Marland Kitchen amLODipine (NORVASC) 2.5 MG tablet Take 1 tablet by mouth daily.   . Cholecalciferol 2000 units CAPS Take 1 capsule by mouth daily.  . hydrochlorothiazide (HYDRODIURIL) 25 MG tablet Take 25 mg by mouth daily.  Marland Kitchen LORazepam (ATIVAN) 0.5 MG tablet Take 1 tablet (0.5 mg total) by mouth daily. agitation that starts at around 2:00; OK to hold for sedation  . memantine (NAMENDA) 5 MG tablet Take 5 mg by mouth 2 (two) times daily.  . Multiple Vitamins-Minerals (CEROVITE SENIOR) TABS Take 1 tablet by mouth daily.  . NON FORMULARY Diet: Regular  . omeprazole (PRILOSEC OTC) 20 MG tablet Take 20 mg by mouth daily.   . potassium chloride SA (K-DUR,KLOR-CON) 20 MEQ tablet Take 20 mEq by mouth 2 (two) times daily.  . sennosides-docusate sodium (SENOKOT-S) 8.6-50 MG tablet Take 2 tablets by mouth 2 (two) times daily.   . sertraline (ZOLOFT) 100 MG tablet Take 100 mg by mouth daily.  . traMADol (ULTRAM) 50 MG tablet Take 1 tablet (50 mg total) by mouth 2 (two) times daily.  . traZODone (DESYREL) 50 MG tablet Take 50 mg by mouth at bedtime.    No facility-administered encounter medications on file as of 05/15/2018.     Review of Systems  Unable to obtain due to dementia    Immunization History  Administered Date(s) Administered  . Influenza Split 01/15/2011  . Influenza-Unspecified 12/16/2012, 12/27/2015, 12/10/2016, 01/01/2018  .  PPD Test 04/12/2016  . Pneumococcal Conjugate-13 06/28/2014  . Pneumococcal Polysaccharide-23 05/05/2012  . Zoster 01/20/2006   Pertinent  Health Maintenance Due  Topic Date Due  . INFLUENZA VACCINE  Completed  . DEXA SCAN  Completed  . PNA vac Low Risk Adult  Completed    Vitals:   05/15/18 1217  BP: (!) 141/67  Pulse: 78  Resp: 18  Temp: 99 F (37.2 C)  TempSrc: Oral  SpO2: 99%  Weight: 144 lb 6.4 oz (65.5 kg)  Height: 5\' 1"  (1.549 m)   Body mass index is 27.28 kg/m.  Physical  Exam  GENERAL APPEARANCE: Well nourished. In no acute distress. Normal body habitus SKIN:  Skin is warm and dry.  MOUTH and THROAT: Lips are without lesions. Oral mucosa is moist and without lesions. RESPIRATORY: Breathing is even & unlabored, BS CTAB CARDIAC: RRR, no murmur,no extra heart sounds, no edema GI: Abdomen soft, normal BS, no masses, no tenderness EXTREMITIES:  Able to move X 4 extremities, unsteady, left leg limp NEUROLOGICAL: There is no tremor. Speech is clear. Disoriented X 3 PSYCHIATRIC: Affect and behavior are appropriate  Labs reviewed: Recent Labs    09/23/17 0400 03/09/18 1345  NA 138 135  K 3.6 3.5  CL 102 99  CO2 31 28  GLUCOSE 86 109*  BUN 16 16  CREATININE 0.75 0.76  CALCIUM 8.7* 9.3  MG 2.1  --    Recent Labs    09/23/17 0400 03/09/18 1345  AST 23 26  ALT 15 15  ALKPHOS 45 57  BILITOT 0.6 0.8  PROT 6.2* 7.0  ALBUMIN 3.5 4.0   Recent Labs    09/23/17 0400 03/09/18 1345  WBC 5.8 8.1  NEUTROABS 3.5  --   HGB 12.8 13.9  HCT 37.7 44.9  MCV 92.0 97.6  PLT 295 337   Lab Results  Component Value Date   TSH 1.896 09/23/2017    Lab Results  Component Value Date   CHOL 253 (H) 01/05/2015   HDL 60 01/05/2015   LDLCALC 165 (H) 01/05/2015   LDLDIRECT 110.8 02/15/2011   TRIG 142 01/05/2015   CHOLHDL 4.2 01/05/2015     Assessment/Plan  1. Unsteady gait - left leg noted to be limping, will ask PT to evaluate for possible brace, fall precautions  2. Benign essential hypertension -Well-controlled, continue amlodipine 2.5 mg 1 tab daily and hydrochlorothiazide 25 mg 1 tab daily  3. Hypokalemia Lab Results  Component Value Date   K 3.5 03/09/2018  -Continue KCl ER 20 meq 1 tab twice a day   4. Major depressive disorder with single episode, remission status unspecified -Mood is a stable, continue sertraline 100 mg 1 tab daily  5. Late onset Alzheimer's disease with behavioral disturbance (HCC) -Continue memantine 5 mg 1 tab twice  a day, supportive care  6.  Anxiety -Continue Ativan 0.5 mg 1 tab daily at 2 PM  7. Chronic pain -Well-controlled, continue tramadol 50 mg 1 tab twice a day   Family/ staff Communication: Discussed plan of care with charge nurse.  Labs/tests ordered:  None  Goals of care:   Long-term care.   Durenda Age, NP [facility] and Adult Medicine [phone] (Monday-Friday 8:00 a.m. - 5:00 p.m.) [phone] (after hours)

## 2018-05-20 ENCOUNTER — Encounter
Admission: RE | Admit: 2018-05-20 | Discharge: 2018-05-20 | Disposition: A | Discharge status: Home / Self Care | Payer: Medicare Other | Source: Ambulatory Visit | Attending: Internal Medicine | Admitting: Internal Medicine

## 2018-05-22 DIAGNOSIS — F0280 Dementia in other diseases classified elsewhere without behavioral disturbance: Secondary | ICD-10-CM | POA: Diagnosis not present

## 2018-05-22 DIAGNOSIS — G301 Alzheimer's disease with late onset: Secondary | ICD-10-CM | POA: Diagnosis not present

## 2018-05-22 DIAGNOSIS — G8929 Other chronic pain: Secondary | ICD-10-CM | POA: Diagnosis not present

## 2018-05-22 DIAGNOSIS — R262 Difficulty in walking, not elsewhere classified: Secondary | ICD-10-CM | POA: Diagnosis not present

## 2018-05-22 DIAGNOSIS — M6281 Muscle weakness (generalized): Secondary | ICD-10-CM | POA: Diagnosis not present

## 2018-05-22 DIAGNOSIS — Z9183 Wandering in diseases classified elsewhere: Secondary | ICD-10-CM | POA: Diagnosis not present

## 2018-06-17 ENCOUNTER — Encounter
Admission: RE | Admit: 2018-06-17 | Discharge: 2018-06-17 | Disposition: A | Discharge status: Home / Self Care | Payer: Medicare Other | Source: Ambulatory Visit | Attending: Internal Medicine | Admitting: Internal Medicine

## 2018-07-06 ENCOUNTER — Other Ambulatory Visit: Payer: Self-pay | Admitting: Adult Health

## 2018-07-06 MED ORDER — LORAZEPAM 0.5 MG PO TABS: 0.5000 mg | ORAL_TABLET | Freq: Every day | ORAL | 0 refills | Status: DC

## 2018-07-09 ENCOUNTER — Non-Acute Institutional Stay: Payer: Medicare Other | Admitting: Adult Health

## 2018-07-09 ENCOUNTER — Encounter: Payer: Self-pay | Admitting: Adult Health

## 2018-07-09 ENCOUNTER — Other Ambulatory Visit: Payer: Self-pay | Admitting: Adult Health

## 2018-07-09 DIAGNOSIS — M8949 Other hypertrophic osteoarthropathy, multiple sites: Secondary | ICD-10-CM | POA: Diagnosis not present

## 2018-07-09 DIAGNOSIS — F419 Anxiety disorder, unspecified: Secondary | ICD-10-CM

## 2018-07-09 DIAGNOSIS — F0281 Dementia in other diseases classified elsewhere with behavioral disturbance: Secondary | ICD-10-CM | POA: Diagnosis not present

## 2018-07-09 DIAGNOSIS — F02818 Dementia in other diseases classified elsewhere, unspecified severity, with other behavioral disturbance: Secondary | ICD-10-CM

## 2018-07-09 DIAGNOSIS — F325 Major depressive disorder, single episode, in full remission: Secondary | ICD-10-CM | POA: Diagnosis not present

## 2018-07-09 DIAGNOSIS — G8929 Other chronic pain: Secondary | ICD-10-CM

## 2018-07-09 DIAGNOSIS — E876 Hypokalemia: Secondary | ICD-10-CM

## 2018-07-09 DIAGNOSIS — K5909 Other constipation: Secondary | ICD-10-CM | POA: Diagnosis not present

## 2018-07-09 DIAGNOSIS — I10 Essential (primary) hypertension: Secondary | ICD-10-CM | POA: Diagnosis not present

## 2018-07-09 DIAGNOSIS — F329 Major depressive disorder, single episode, unspecified: Secondary | ICD-10-CM

## 2018-07-09 DIAGNOSIS — G301 Alzheimer's disease with late onset: Secondary | ICD-10-CM

## 2018-07-09 DIAGNOSIS — Z593 Problems related to living in residential institution: Secondary | ICD-10-CM | POA: Diagnosis not present

## 2018-07-09 MED ORDER — TRAMADOL HCL 50 MG PO TABS: 50.0000 mg | ORAL_TABLET | Freq: Two times a day (BID) | ORAL | 0 refills | Status: DC

## 2018-07-09 NOTE — Progress Notes (Signed)
[facility]:  [facility] Room Number: [place]:  ALF [id number]) Provider:  Durenda Age, NP  Patient Care Team: Kirk Ruths, MD as PCP - General (Internal Medicine) Gerlene Fee, NP as Nurse Practitioner (Geriatric Medicine)  Extended Emergency Contact Information Primary Emergency Contact: Estelle [place] of [place] Phone: [phone] Relation: Friend Secondary Emergency Contact: SARFATI,ELIZABETH Address: [place] Phone: [phone] Relation: None  Code Status: FULL  Goals of care: Advanced Directive information Advanced Directives 07/09/2018  Does Patient Have a Medical Advance Directive? No  Would patient like information on creating a medical advance directive? -     Chief Complaint  Patient presents with  . Medical Management of Chronic Issues    Routine Visit    HPI:  Pt is a 85 y.o. female seen today for medical management of chronic diseases.  She has PMH of hypertension, Alzheimer's disease and constipation. It was reported that she was noted to be constipated. She is non-verbal. She takes Acetaminophen and Tramadol for chronic pain. She was noted to be sitting on a chair with legs crossed. BPs 136/66, 136/75, 141/90.  Past Medical History:  Diagnosis Date  . Alzheimer's dementia with behavioral disturbance ([place])   . Breast cancer ([place])   . Hypertension    Past Surgical History:  Procedure Laterality Date  . ABDOMINAL HYSTERECTOMY     Still have ovaries  . BREAST BIOPSY Left 1985  . BREAST RECONSTRUCTION Left   . MASTECTOMY     Right  . TONSILLECTOMY  1942    Allergies  Allergen Reactions  . Sulfa Antibiotics     Outpatient Encounter Medications as of 07/09/2018  Medication Sig  . acetaminophen (TYLENOL) 325 MG tablet Take 650 mg by mouth 3 (three) times daily.  Marland Kitchen amLODipine (NORVASC) 2.5 MG tablet Take 1 tablet by mouth daily.   . Cholecalciferol  2000 units CAPS Take 1 capsule by mouth daily.  . hydrochlorothiazide (HYDRODIURIL) 25 MG tablet Take 25 mg by mouth daily. Hold for BP less than 100/s  . LORazepam (ATIVAN) 0.5 MG tablet Take 1 tablet (0.5 mg total) by mouth daily. agitation that starts at around 2:00; OK to hold for sedation  . memantine (NAMENDA) 5 MG tablet Take 5 mg by mouth 2 (two) times daily.  . Multiple Vitamins-Minerals (CEROVITE SENIOR) TABS Take 1 tablet by mouth daily.  . NON FORMULARY Diet: Regular  . omeprazole (PRILOSEC OTC) 20 MG tablet Take 20 mg by mouth daily.   . potassium chloride SA (K-DUR,KLOR-CON) 20 MEQ tablet Take 20 mEq by mouth 2 (two) times daily.  . sennosides-docusate sodium (SENOKOT-S) 8.6-50 MG tablet Take 2 tablets by mouth 2 (two) times daily.   . sertraline (ZOLOFT) 100 MG tablet Take 100 mg by mouth daily.  . traMADol (ULTRAM) 50 MG tablet Take 1 tablet (50 mg total) by mouth 2 (two) times daily.  . traZODone (DESYREL) 50 MG tablet Take 50 mg by mouth at bedtime.    No facility-administered encounter medications on file as of 07/09/2018.     Review of Systems  Unable to obtain due to dementia    Immunization History  Administered Date(s) Administered  . Influenza Split 01/15/2011  . Influenza-Unspecified 12/16/2012, 12/27/2015, 12/10/2016, 01/01/2018  . PPD Test 04/12/2016  . Pneumococcal Conjugate-13 06/28/2014  . Pneumococcal Polysaccharide-23 05/05/2012  . Zoster 01/20/2006   Pertinent  Health Maintenance Due  Topic  Date Due  . INFLUENZA VACCINE  10/17/2018  . DEXA SCAN  Completed  . PNA vac Low Risk Adult  Completed   No flowsheet data found.   Vitals:   07/09/18 1106  BP: 136/66  Pulse: 87  Resp: 16  Temp: 97.9 F (36.6 C)  TempSrc: Oral  SpO2: 99%  Weight: 145 lb (65.8 kg)  Height: 5\' 1"  (1.549 m)   Body mass index is 27.4 kg/m.  Physical Exam  GENERAL APPEARANCE: Well nourished. In no acute distress. Normal body habitus SKIN:  Skin is warm and dry.   MOUTH and THROAT: Lips are without lesions.  RESPIRATORY: Breathing is even & unlabored, BS CTAB CARDIAC: RRR, no murmur,no extra heart sounds, no edema GI: Abdomen soft, normal BS, no masses, no tenderness EXTREMITIES:  Able to move X 4 extremities NEUROLOGICAL: There is no tremor. Speech is clear. Disoriented X 3. PSYCHIATRIC:  Affect and behavior are appropriate  Labs reviewed: Recent Labs    09/23/17 0400 03/09/18 1345  NA 138 135  K 3.6 3.5  CL 102 99  CO2 31 28  GLUCOSE 86 109*  BUN 16 16  CREATININE 0.75 0.76  CALCIUM 8.7* 9.3  MG 2.1  --    Recent Labs    09/23/17 0400 03/09/18 1345  AST 23 26  ALT 15 15  ALKPHOS 45 57  BILITOT 0.6 0.8  PROT 6.2* 7.0  ALBUMIN 3.5 4.0   Recent Labs    09/23/17 0400 03/09/18 1345  WBC 5.8 8.1  NEUTROABS 3.5  --   HGB 12.8 13.9  HCT 37.7 44.9  MCV 92.0 97.6  PLT 295 337   Lab Results  Component Value Date   TSH 1.896 09/23/2017   No results found for: HGBA1C Lab Results  Component Value Date   CHOL 253 (H) 01/05/2015   HDL 60 01/05/2015   LDLCALC 165 (H) 01/05/2015   LDLDIRECT 110.8 02/15/2011   TRIG 142 01/05/2015   CHOLHDL 4.2 01/05/2015     Assessment/Plan  1. Chronic hypokalemia Lab Results  Component Value Date   K 3.5 03/09/2018  -Continue KCl ER 20 meq 1 tab twice a day  2. Chronic constipation -Was reported to be constipated, will start MiraLAX 17 g daily and continue senna S8 0.6-50 mg 2 tabs twice a day  3. Benign essential hypertension -Well-controlled, continue amlodipine 2.5 mg 1 tab daily and hydrochlorothiazide 25 mg 1 tab daily  4. Anxiety -Mood is stable, continue Lorazepam 0.5 mg 1 tab daily at 2 PM  5. Major depressive disorder with single episode, remission status unspecified -Stable, continue sertraline 100 mg 1 tab daily  6. Late onset Alzheimer's disease with behavioral disturbance (HCC) -Continue memantine 5 mg 1 tab twice a day, supportive care and fall precautions  7.   Chronic pain -Well-controlled, continue tramadol 50 mg 1 tab twice a day and discontinue acetaminophen   Family/ staff Communication: Discussed plan of care with charge nurse.  Labs/tests ordered: None  Goals of care:   Long-term ALF  Durenda Age, NP [facility] and Adult Medicine [phone] (Monday-Friday 8:00 a.m. - 5:00 p.m.) [phone] (after hours)

## 2018-07-17 ENCOUNTER — Encounter
Admission: RE | Admit: 2018-07-17 | Discharge: 2018-07-17 | Disposition: A | Discharge status: Home / Self Care | Payer: Medicare Other | Source: Ambulatory Visit | Attending: Internal Medicine | Admitting: Internal Medicine

## 2018-07-20 ENCOUNTER — Other Ambulatory Visit: Payer: Self-pay | Admitting: Adult Health

## 2018-07-20 MED ORDER — LORAZEPAM 0.5 MG PO TABS: 0.5000 mg | ORAL_TABLET | Freq: Every day | ORAL | 0 refills | Status: DC

## 2018-08-06 ENCOUNTER — Other Ambulatory Visit: Payer: Self-pay

## 2018-08-06 MED ORDER — LORAZEPAM 0.5 MG PO TABS: 0.5000 mg | ORAL_TABLET | Freq: Every day | ORAL | 0 refills | Status: DC

## 2018-08-06 NOTE — Telephone Encounter (Signed)
Request filled, pended, and forwarded to provider for approval and transmittal to [facility].

## 2018-08-13 ENCOUNTER — Other Ambulatory Visit: Payer: Self-pay | Admitting: Adult Health

## 2018-08-13 MED ORDER — TRAMADOL HCL 50 MG PO TABS: 50.0000 mg | ORAL_TABLET | Freq: Two times a day (BID) | ORAL | 0 refills | Status: DC

## 2018-08-17 ENCOUNTER — Encounter
Admission: RE | Admit: 2018-08-17 | Discharge: 2018-08-17 | Disposition: A | Discharge status: Home / Self Care | Payer: Medicare Other | Source: Ambulatory Visit | Attending: Internal Medicine | Admitting: Internal Medicine

## 2018-08-20 ENCOUNTER — Other Ambulatory Visit: Payer: Self-pay | Admitting: Adult Health

## 2018-08-20 MED ORDER — LORAZEPAM 0.5 MG PO TABS: 0.5000 mg | ORAL_TABLET | Freq: Every day | ORAL | 0 refills | Status: DC

## 2018-08-25 DIAGNOSIS — Z20828 Contact with and (suspected) exposure to other viral communicable diseases: Secondary | ICD-10-CM | POA: Diagnosis not present

## 2018-09-03 ENCOUNTER — Non-Acute Institutional Stay: Payer: Medicare Other | Admitting: Adult Health

## 2018-09-03 ENCOUNTER — Encounter: Payer: Self-pay | Admitting: Adult Health

## 2018-09-03 DIAGNOSIS — K219 Gastro-esophageal reflux disease without esophagitis: Secondary | ICD-10-CM | POA: Diagnosis not present

## 2018-09-03 DIAGNOSIS — F418 Other specified anxiety disorders: Secondary | ICD-10-CM | POA: Diagnosis not present

## 2018-09-03 DIAGNOSIS — E876 Hypokalemia: Secondary | ICD-10-CM

## 2018-09-03 DIAGNOSIS — I10 Essential (primary) hypertension: Secondary | ICD-10-CM | POA: Diagnosis not present

## 2018-09-03 DIAGNOSIS — G8929 Other chronic pain: Secondary | ICD-10-CM | POA: Insufficient documentation

## 2018-09-03 NOTE — Progress Notes (Signed)
[facility] Room Number: [place]:  ALF [phone]) Provider:  Durenda Age, DNP, FNP-BC  Patient Care Team: Kirk Ruths, MD as PCP - General (Internal Medicine) Gerlene Fee, NP as Nurse Practitioner (Geriatric Medicine)  Extended Emergency Contact Information Primary Emergency Contact: Estelle Grumbles [place] Phone: [phone] Relation: Friend Secondary Emergency Contact: [facility] Address: [place] Phone: [phone] Relation: None  Code Status:  Full Code  Goals of care: Advanced Directive information Advanced Directives 07/09/2018  Does Patient Have a Medical Advance Directive? No  Would patient like information on creating a medical advance directive? -     Chief Complaint  Patient presents with  . Medical Management of Chronic Issues    Routine [facility] ALF visit    HPI:  Emily Summers is an 85 y.o. female seen today for medical management of chronic diseases.  She is a long term resident of [facility], currently at the ALF level of care. She has a PMH of HTN, Alzheimer's disease, and constipation. She was seen sitting down on the by the tv area fidgeting with a baby doll. She foot of the plastic doll in her mouth then removed it. She was humming and no clear words can be heard. BPs noted to be elevated -134/66, 147/101, 132/64, 136/76, 155/60, 166/64, 161/66.  She currently takes amlodipine and hydrochlorothiazide for hypertension. The last CMP was done 6 months ago.    Past Medical History:  Diagnosis Date  . Alzheimer's dementia with behavioral disturbance ([place])   . Breast cancer ([place])   . Hypertension    Past Surgical History:  Procedure Laterality Date  . ABDOMINAL HYSTERECTOMY     Still have ovaries  . BREAST BIOPSY Left 1985  . BREAST RECONSTRUCTION Left   . MASTECTOMY     Right  . TONSILLECTOMY  1942    Allergies  Allergen Reactions  . Sulfa  Antibiotics     Outpatient Encounter Medications as of 09/03/2018  Medication Sig  . amLODipine (NORVASC) 2.5 MG tablet Take 1 tablet by mouth daily.   . Cholecalciferol 2000 units CAPS Take 1 capsule by mouth daily.  . hydrochlorothiazide (HYDRODIURIL) 25 MG tablet Take 25 mg by mouth daily. Hold for BP less than 100/s  . LORazepam (ATIVAN) 0.5 MG tablet Take 1 tablet (0.5 mg total) by mouth daily. agitation that starts at around 2:00; OK to hold for sedation  . memantine (NAMENDA) 5 MG tablet Take 5 mg by mouth 2 (two) times daily.  . Multiple Vitamins-Minerals (CEROVITE SENIOR) TABS Take 1 tablet by mouth daily.  . NON FORMULARY Diet: Regular  . omeprazole (PRILOSEC OTC) 20 MG tablet Take 20 mg by mouth daily.   . polyethylene glycol (MIRALAX / GLYCOLAX) 17 g packet Take 17 g by mouth daily.  . potassium chloride SA (K-DUR,KLOR-CON) 20 MEQ tablet Take 20 mEq by mouth 2 (two) times daily.  . sennosides-docusate sodium (SENOKOT-S) 8.6-50 MG tablet Take 2 tablets by mouth 2 (two) times daily.   . sertraline (ZOLOFT) 100 MG tablet Take 100 mg by mouth daily.  . traMADol (ULTRAM) 50 MG tablet Take 1 tablet (50 mg total) by mouth 2 (two) times daily.  . traZODone (DESYREL) 50 MG tablet Take 50 mg by mouth at bedtime.   . [DISCONTINUED] acetaminophen (TYLENOL) 325 MG tablet Take 650 mg by mouth 3 (three) times daily.   No facility-administered  encounter medications on file as of 09/03/2018.     Review of Systems  Unable to obtain due to dementia.    Immunization History  Administered Date(s) Administered  . Influenza Split 01/15/2011  . Influenza-Unspecified 12/16/2012, 12/27/2015, 12/10/2016, 01/01/2018  . PPD Test 04/12/2016  . Pneumococcal Conjugate-13 06/28/2014  . Pneumococcal Polysaccharide-23 05/05/2012  . Zoster 01/20/2006   Pertinent  Health Maintenance Due  Topic Date Due  . INFLUENZA VACCINE  10/17/2018  . DEXA SCAN  Completed  . PNA vac Low Risk Adult  Completed     Vitals:   09/03/18 1046  BP: 134/66  Pulse: 66  Resp: 18  Temp: 97.6 F (36.4 C)  TempSrc: Oral  SpO2: 96%  Weight: 142 lb 14.4 oz (64.8 kg)  Height: 5\' 1"  (1.549 m)   Body mass index is 27 kg/m.  Physical Exam  GENERAL APPEARANCE: Well nourished. In no acute distress. Normal body habitus SKIN:  Skin is warm and dry.  MOUTH and THROAT: Lips are without lesions. Oral mucosa is moist and without lesions.  RESPIRATORY: Breathing is even & unlabored, BS CTAB CARDIAC: RRR, no murmur,no extra heart sounds, no edema GI: Abdomen soft, normal BS, no masses, no tenderness EXTREMITIES:  Able to move X 4 extremities NEUROLOGICAL: There is no tremor. Humming and gibberish PSYCHIATRIC:  Flat affect  Labs reviewed: Recent Labs    09/23/17 0400 03/09/18 1345  NA 138 135  K 3.6 3.5  CL 102 99  CO2 31 28  GLUCOSE 86 109*  BUN 16 16  CREATININE 0.75 0.76  CALCIUM 8.7* 9.3  MG 2.1  --    Recent Labs    09/23/17 0400 03/09/18 1345  AST 23 26  ALT 15 15  ALKPHOS 45 57  BILITOT 0.6 0.8  PROT 6.2* 7.0  ALBUMIN 3.5 4.0   Recent Labs    09/23/17 0400 03/09/18 1345  WBC 5.8 8.1  NEUTROABS 3.5  --   HGB 12.8 13.9  HCT 37.7 44.9  MCV 92.0 97.6  PLT 295 337   Lab Results  Component Value Date   TSH 1.896 09/23/2017    Lab Results  Component Value Date   CHOL 253 (H) 01/05/2015   HDL 60 01/05/2015   LDLCALC 165 (H) 01/05/2015   LDLDIRECT 110.8 02/15/2011   TRIG 142 01/05/2015   CHOLHDL 4.2 01/05/2015     Assessment/Plan  1. Uncontrolled hypertension -BPs elevated, will increase amlodipine from 2.5 mg to 5 mg daily, continue hydrochlorothiazide 25 mg 1 tab daily  2. Chronic hypokalemia Lab Results  Component Value Date   K 3.5 03/09/2018  -Continue KCl 20 meq 1 tab twice a day, will check CMP  3. Depression with anxiety -Has flat affect continue sertraline 100 mg 1 tab daily and lorazepam 0.5 mg 1 tab at 2 PM  4. Other chronic pain -No grimacing noted,  appears comfortable, continue tramadol 50 mg 1 tab twice a day  5. Gastroesophageal reflux disease without esophagitis -Continue Prilosec 20 mg daily    Family/ staff Communication:  Discussed plan of care with resident.  Labs/tests ordered:  None  Goals of care:   Long-term care.   Durenda Age, DNP, FNP-BC [facility] [phone] (Monday-Friday 8:00 a.m. - 5:00 p.m.) [phone] (after hours)

## 2018-09-04 ENCOUNTER — Other Ambulatory Visit
Admission: RE | Admit: 2018-09-04 | Discharge: 2018-09-04 | Disposition: A | Discharge status: Home / Self Care | Payer: Medicare Other | Source: Skilled Nursing Facility | Attending: Adult Health | Admitting: Adult Health

## 2018-09-04 DIAGNOSIS — F329 Major depressive disorder, single episode, unspecified: Secondary | ICD-10-CM | POA: Insufficient documentation

## 2018-09-04 LAB — COMPREHENSIVE METABOLIC PANEL
ALT: 13 U/L (ref 0–44)
AST: 25 U/L (ref 15–41)
Albumin: 3.5 g/dL (ref 3.5–5.0)
Alkaline Phosphatase: 54 U/L (ref 38–126)
Anion gap: 7 (ref 5–15)
BUN: 19 mg/dL (ref 8–23)
CO2: 30 mmol/L (ref 22–32)
Calcium: 9.2 mg/dL (ref 8.9–10.3)
Chloride: 104 mmol/L (ref 98–111)
Creatinine, Ser: 0.7 mg/dL (ref 0.44–1.00)
GFR calc Af Amer: >60 mL/min (ref >60)
GFR calc non Af Amer: >60 mL/min (ref >60)
Glucose, Bld: 95 mg/dL (ref 70–99)
Potassium: 3.8 mmol/L (ref 3.5–5.1)
Sodium: 141 mmol/L (ref 135–145)
Total Bilirubin: 0.5 mg/dL (ref 0.3–1.2)
Total Protein: 6.5 g/dL (ref 6.5–8.1)

## 2018-09-04 LAB — HEMOGLOBIN A1C
Hgb A1c MFr Bld: 5.1 % (ref 4.8–5.6)
Mean Plasma Glucose: 99.67 mg/dL

## 2018-09-04 LAB — LIPID PANEL
Cholesterol: 221 mg/dL — ABNORMAL HIGH (ref 0–200)
HDL: 59 mg/dL (ref >40)
LDL Cholesterol: 145 mg/dL — ABNORMAL HIGH (ref 0–99)
Total CHOL/HDL Ratio: 3.7 RATIO
Triglycerides: 85 mg/dL (ref <150)
VLDL: 17 mg/dL (ref 0–40)

## 2018-09-04 LAB — CBC
HCT: 41.4 % (ref 36.0–46.0)
Hemoglobin: 13 g/dL (ref 12.0–15.0)
MCH: 29.8 pg (ref 26.0–34.0)
MCHC: 31.4 g/dL (ref 30.0–36.0)
MCV: 95 fL (ref 80.0–100.0)
Platelets: 302 10*3/uL (ref 150–400)
RBC: 4.36 MIL/uL (ref 3.87–5.11)
RDW: 13 % (ref 11.5–15.5)
WBC: 6.5 10*3/uL (ref 4.0–10.5)
nRBC: 0 % (ref 0.0–0.2)

## 2018-09-04 LAB — TSH: TSH: 2.585 u[IU]/mL (ref 0.350–4.500)

## 2018-09-14 ENCOUNTER — Other Ambulatory Visit: Payer: Self-pay | Admitting: Adult Health

## 2018-09-14 MED ORDER — TRAMADOL HCL 50 MG PO TABS: 50.0000 mg | ORAL_TABLET | Freq: Two times a day (BID) | ORAL | 0 refills | Status: AC

## 2018-09-14 MED ORDER — LORAZEPAM 0.5 MG PO TABS: 0.5000 mg | ORAL_TABLET | Freq: Every day | ORAL | 0 refills | Status: AC

## 2018-09-14 NOTE — Telephone Encounter (Signed)
Refill authorization completed, pended, and sent to NP for approval and transmittal to [facility].

## 2018-09-16 ENCOUNTER — Encounter
Admission: RE | Admit: 2018-09-16 | Discharge: 2018-09-16 | Disposition: A | Discharge status: Home / Self Care | Payer: Medicare Other | Source: Ambulatory Visit | Attending: Internal Medicine | Admitting: Internal Medicine

## 2018-10-17 ENCOUNTER — Encounter
Admission: RE | Admit: 2018-10-17 | Discharge: 2018-10-17 | Disposition: A | Discharge status: Home / Self Care | Payer: Medicare Other | Source: Ambulatory Visit | Attending: Internal Medicine | Admitting: Internal Medicine

## 2018-10-19 ENCOUNTER — Other Ambulatory Visit: Payer: Self-pay

## 2018-10-20 ENCOUNTER — Other Ambulatory Visit: Payer: Self-pay

## 2018-10-20 ENCOUNTER — Emergency Department: Payer: Medicare Other

## 2018-10-20 ENCOUNTER — Emergency Department
Admission: EM | Admit: 2018-10-20 | Discharge: 2018-10-20 | Disposition: A | Discharge status: Skilled Nursing Facility | Payer: Medicare Other | Attending: Emergency Medicine | Admitting: Emergency Medicine

## 2018-10-20 DIAGNOSIS — R404 Transient alteration of awareness: Secondary | ICD-10-CM | POA: Diagnosis not present

## 2018-10-20 DIAGNOSIS — S299XXA Unspecified injury of thorax, initial encounter: Secondary | ICD-10-CM | POA: Diagnosis not present

## 2018-10-20 DIAGNOSIS — Z882 Allergy status to sulfonamides status: Secondary | ICD-10-CM | POA: Diagnosis not present

## 2018-10-20 DIAGNOSIS — R450 Nervousness: Secondary | ICD-10-CM | POA: Diagnosis not present

## 2018-10-20 DIAGNOSIS — H9211 Otorrhea, right ear: Secondary | ICD-10-CM | POA: Diagnosis present

## 2018-10-20 DIAGNOSIS — S199XXA Unspecified injury of neck, initial encounter: Secondary | ICD-10-CM | POA: Diagnosis not present

## 2018-10-20 DIAGNOSIS — G309 Alzheimer's disease, unspecified: Secondary | ICD-10-CM | POA: Diagnosis not present

## 2018-10-20 DIAGNOSIS — F0281 Dementia in other diseases classified elsewhere with behavioral disturbance: Secondary | ICD-10-CM | POA: Insufficient documentation

## 2018-10-20 DIAGNOSIS — Z79899 Other long term (current) drug therapy: Secondary | ICD-10-CM | POA: Diagnosis not present

## 2018-10-20 DIAGNOSIS — S3993XA Unspecified injury of pelvis, initial encounter: Secondary | ICD-10-CM | POA: Diagnosis not present

## 2018-10-20 DIAGNOSIS — Z853 Personal history of malignant neoplasm of breast: Secondary | ICD-10-CM | POA: Insufficient documentation

## 2018-10-20 DIAGNOSIS — I10 Essential (primary) hypertension: Secondary | ICD-10-CM | POA: Insufficient documentation

## 2018-10-20 DIAGNOSIS — R0902 Hypoxemia: Secondary | ICD-10-CM | POA: Diagnosis not present

## 2018-10-20 DIAGNOSIS — H73011 Bullous myringitis, right ear: Secondary | ICD-10-CM

## 2018-10-20 DIAGNOSIS — Z7401 Bed confinement status: Secondary | ICD-10-CM | POA: Diagnosis not present

## 2018-10-20 DIAGNOSIS — W19XXXA Unspecified fall, initial encounter: Secondary | ICD-10-CM | POA: Diagnosis not present

## 2018-10-20 DIAGNOSIS — S0990XA Unspecified injury of head, initial encounter: Secondary | ICD-10-CM | POA: Diagnosis not present

## 2018-10-20 DIAGNOSIS — M2550 Pain in unspecified joint: Secondary | ICD-10-CM | POA: Diagnosis not present

## 2018-10-20 DIAGNOSIS — R102 Pelvic and perineal pain: Secondary | ICD-10-CM | POA: Diagnosis not present

## 2018-10-20 DIAGNOSIS — R5381 Other malaise: Secondary | ICD-10-CM | POA: Diagnosis not present

## 2018-10-20 LAB — BASIC METABOLIC PANEL
Anion gap: 9 (ref 5–15)
BUN: 18 mg/dL (ref 8–23)
CO2: 29 mmol/L (ref 22–32)
Calcium: 9.3 mg/dL (ref 8.9–10.3)
Chloride: 102 mmol/L (ref 98–111)
Creatinine, Ser: 0.72 mg/dL (ref 0.44–1.00)
GFR calc Af Amer: >60 mL/min (ref >60)
GFR calc non Af Amer: >60 mL/min (ref >60)
Glucose, Bld: 101 mg/dL — ABNORMAL HIGH (ref 70–99)
Potassium: 3.6 mmol/L (ref 3.5–5.1)
Sodium: 140 mmol/L (ref 135–145)

## 2018-10-20 LAB — CBC WITH DIFFERENTIAL/PLATELET
Abs Immature Granulocytes: 0.05 10*3/uL (ref 0.00–0.07)
Basophils Absolute: 0.1 10*3/uL (ref 0.0–0.1)
Basophils Relative: 1 %
Eosinophils Absolute: 0.2 10*3/uL (ref 0.0–0.5)
Eosinophils Relative: 2 %
HCT: 44.3 % (ref 36.0–46.0)
Hemoglobin: 14.2 g/dL (ref 12.0–15.0)
Immature Granulocytes: 1 %
Lymphocytes Relative: 16 %
Lymphs Abs: 1.6 10*3/uL (ref 0.7–4.0)
MCH: 30.2 pg (ref 26.0–34.0)
MCHC: 32.1 g/dL (ref 30.0–36.0)
MCV: 94.3 fL (ref 80.0–100.0)
Monocytes Absolute: 0.8 10*3/uL (ref 0.1–1.0)
Monocytes Relative: 8 %
Neutro Abs: 7.3 10*3/uL (ref 1.7–7.7)
Neutrophils Relative %: 72 %
Platelets: 300 10*3/uL (ref 150–400)
RBC: 4.7 MIL/uL (ref 3.87–5.11)
RDW: 13.3 % (ref 11.5–15.5)
WBC: 10 10*3/uL (ref 4.0–10.5)
nRBC: 0 % (ref 0.0–0.2)

## 2018-10-20 MED ORDER — AMOXICILLIN-POT CLAVULANATE 875-125 MG PO TABS: 1.0000 | ORAL_TABLET | Freq: Two times a day (BID) | ORAL | 0 refills | Status: AC

## 2018-10-20 NOTE — ED Notes (Signed)
Pt unable to sign for discharge 

## 2018-10-20 NOTE — ED Notes (Signed)
Attempted to call report to [facility] with no success.

## 2018-10-20 NOTE — ED Notes (Signed)
Pt cleaned up with brief change by this RN and Arby Barrette, Therapist, sports.

## 2018-10-20 NOTE — ED Triage Notes (Signed)
Pt [facility]. Pt fell while being left alone in the bathroom with nursing staff and she hit her head. Pt has hx of dementia and Alzheimer's. EMS reports that pt had fluid coming out of her right ear but nursing staff stated that they noticed it this morning before the fall.

## 2018-10-20 NOTE — ED Provider Notes (Signed)
[facility] Emergency Department Provider Note  ____________________________________________   First MD Initiated Contact with Patient 10/20/18 1539     (approximate)  I have reviewed the triage vital signs and the nursing notes.   HISTORY  Chief Complaint Fall    HPI Emily Summers is a 85 y.o. female with Alzheimer's, hypertension who presents from the [place] with a fall.  Patient fell will be left alone in the bathroom with nursing staff and she hit her head.  There was report of fluid coming out of her right ear with sounds like staff noticed this prior to her fall.  Patient has not had any fevers.  Unable to get full HPI due to patient's baseline Alzheimer's disease      Past Medical History:  Diagnosis Date   Alzheimer's dementia with behavioral disturbance ([place])    Breast cancer ([place])    Hypertension     Patient Active Problem List   Diagnosis Date Noted   Uncontrolled hypertension 09/03/2018   Depression with anxiety 09/03/2018   Chronic pain 09/03/2018   GERD (gastroesophageal reflux disease) 09/03/2018   Benign essential hypertension 01/12/2018   Chronic constipation 01/12/2018   Chronic hypokalemia 01/12/2018   Wandering in diseases classified elsewhere 06/19/2016   Major depression, single episode 06/19/2016   Allergic rhinitis 12/26/2014   Alzheimer's dementia with behavioral disturbance ([place]) 12/26/2014   Personal history of malignant neoplasm of breast 12/26/2014   Osteoarthritis 12/26/2014    Past Surgical History:  Procedure Laterality Date   ABDOMINAL HYSTERECTOMY     Still have ovaries   BREAST BIOPSY Left 1985   BREAST RECONSTRUCTION Left    MASTECTOMY     Right   TONSILLECTOMY  1942    Prior to Admission medications   Medication Sig Start Date End Date Taking? Authorizing Provider  amLODipine (NORVASC) 2.5 MG tablet Take 1 tablet by mouth daily.  07/25/14   [provider]  Cholecalciferol 2000 units CAPS Take 1 capsule by mouth daily.    [provider]  hydrochlorothiazide (HYDRODIURIL) 25 MG tablet Take 25 mg by mouth daily. Hold for BP less than 100/s    [provider]  LORazepam (ATIVAN) 0.5 MG tablet Take 1 tablet (0.5 mg total) by mouth daily. agitation that starts at around 2:00; OK to hold for sedation 09/14/18   Medina-Vargas, Monina C, NP  memantine (NAMENDA) 5 MG tablet Take 5 mg by mouth 2 (two) times daily.    [provider]  Multiple Vitamins-Minerals (CEROVITE SENIOR) TABS Take 1 tablet by mouth daily.    [provider]  NON FORMULARY Diet: Regular    [provider]  omeprazole (PRILOSEC OTC) 20 MG tablet Take 20 mg by mouth daily.  02/05/18   [provider]  polyethylene glycol (MIRALAX / GLYCOLAX) 17 g packet Take 17 g by mouth daily.    [provider]  potassium chloride SA (K-DUR,KLOR-CON) 20 MEQ tablet Take 20 mEq by mouth 2 (two) times daily.    [provider]  sennosides-docusate sodium (SENOKOT-S) 8.6-50 MG tablet Take 2 tablets by mouth 2 (two) times daily.     [provider]  sertraline (ZOLOFT) 100 MG tablet Take 100 mg by mouth daily.    [provider]  traMADol (ULTRAM) 50 MG tablet Take 1 tablet (50 mg total) by mouth 2 (two) times daily. 09/14/18   Medina-Vargas, Monina C, NP  traZODone (DESYREL) 50 MG tablet Take 50 mg by  mouth at bedtime.     [provider]    Allergies Sulfa antibiotics  No family history on file.  Social History Social History   Tobacco Use   Smoking status: Never Smoker   Smokeless tobacco: Never Used  Substance Use Topics   Alcohol use: Yes    Comment: Occasional   Drug use: No      Review of Systems Unable to get review of systems due to baseline altered mental status from her dementia ____________________________________________   PHYSICAL EXAM:  VITAL  SIGNS: Blood pressure (!) 148/78, pulse 71, temperature 98 F (36.7 C), temperature source Oral, resp. rate 18, height 5\' 1"  (1.549 m), weight 68 kg, SpO2 99 %.   Constitutional: Alert but not oriented due to baseline dementia Eyes: Conjunctivae are normal. EOMI. Head: Atraumatic. Nose: No congestion/rhinnorhea.  Right ear with cloudy TM with some liquid draining out of it, vesicle on the TM, no erythema around the outside of the ear or vesicles on the outside of ear, no mastoid tenderness, no bulging mastoid.  Mouth/Throat: Mucous membranes are moist.   Neck: No stridor. Trachea Midline. FROM Cardiovascular: Normal rate, regular rhythm. Grossly normal heart sounds.  Good peripheral circulation. No chest wall tenderness Respiratory: Normal respiratory effort.  No retractions. Lungs CTAB. Gastrointestinal: Soft and nontender. No distention. No abdominal bruits.  Musculoskeletal:   RUE: No point tenderness, deformity or other signs of injury. Radial pulse intact. Neuro intact. Full ROM in joint. LUE: No point tenderness, deformity or other signs of injury. Radial pulse intact. Neuro intact. Full ROM in joints RLE: No point tenderness, deformity or other signs of injury. DP pulse intact. Neuro intact. Full ROM in joints. LLE: Small bruise on the left hip. DP pulse intact. Neuro intact. Full ROM in joints. Neurologic: Patient moving her arm spontaneously.  Able to passively range her legs.  Mumbling noises which sounds like her baseline Skin:  Skin is warm, dry and intact. No rash noted. Psychiatric: Unable to assess due to patient's baseline dementia GU: Deferred   ____________________________________________   LABS (all labs ordered are listed, but only abnormal results are displayed)  Labs Reviewed  BASIC METABOLIC PANEL - Abnormal; Notable for the following components:      Result Value   Glucose, Bld 101 (*)    All other components within normal limits  BODY FLUID CULTURE  HSV  CULTURE AND TYPING  CBC WITH DIFFERENTIAL/PLATELET   ____________________________________________   ED ECG REPORT I, Vanessa [place], the attending physician, personally viewed and interpreted this ECG.  EKG is sinus rate of 70, no ST elevation, T wave inversion in V2, right bundle branch block, no prior EKG to compare to ____________________________________________  RADIOLOGY I, Vanessa [place], personally viewed and evaluated these images (plain radiographs) as part of my medical decision making, as well as reviewing the written report by the radiologist.  ED MD interpretation: X-rays reviewed and without evidence of fractures  Official radiology report(s): Ct Head Wo Contrast  Result Date: 10/20/2018 CLINICAL DATA:  Dementia patient who fell in the bathroom. EXAM: CT HEAD WITHOUT CONTRAST CT CERVICAL SPINE WITHOUT CONTRAST TECHNIQUE: Multidetector CT imaging of the head and cervical spine was performed following the standard protocol without intravenous contrast. Multiplanar CT image reconstructions of the cervical spine were also generated. COMPARISON:  None. FINDINGS: CT HEAD FINDINGS Brain: Generalized brain atrophy with some frontal and temporal predominance. Chronic small-vessel ischemic changes of the white matter. No sign of acute infarction, mass lesion,  hemorrhage, hydrocephalus or extra-axial collection. Vascular: There is atherosclerotic calcification of the major vessels at the base of the brain. Skull: No skull fracture. Sinuses/Orbits: Clear/normal Other: None CT CERVICAL SPINE FINDINGS Alignment: No traumatic malalignment. Skull base and vertebrae: No fracture. Soft tissues and spinal canal: No soft tissue swelling. Disc levels: Chronic degenerative spondylosis throughout the cervical region with disc space narrowing and small osteophytes. Facet osteoarthritis at C7-T1 left worse than right. Upper chest: Negative Other: None IMPRESSION: Head CT: No acute or traumatic finding.  Generalized atrophy with frontal and temporal predominance. Chronic small-vessel disease. Cervical spine CT: No acute or traumatic finding. Chronic degenerative spondylosis and left worse than right C7-T1 facet arthritis. Electronically Signed   By: Nelson Chimes M.D.   On: 10/20/2018 16:05   Ct Cervical Spine Wo Contrast  Result Date: 10/20/2018 CLINICAL DATA:  Dementia patient who fell in the bathroom. EXAM: CT HEAD WITHOUT CONTRAST CT CERVICAL SPINE WITHOUT CONTRAST TECHNIQUE: Multidetector CT imaging of the head and cervical spine was performed following the standard protocol without intravenous contrast. Multiplanar CT image reconstructions of the cervical spine were also generated. COMPARISON:  None. FINDINGS: CT HEAD FINDINGS Brain: Generalized brain atrophy with some frontal and temporal predominance. Chronic small-vessel ischemic changes of the white matter. No sign of acute infarction, mass lesion, hemorrhage, hydrocephalus or extra-axial collection. Vascular: There is atherosclerotic calcification of the major vessels at the base of the brain. Skull: No skull fracture. Sinuses/Orbits: Clear/normal Other: None CT CERVICAL SPINE FINDINGS Alignment: No traumatic malalignment. Skull base and vertebrae: No fracture. Soft tissues and spinal canal: No soft tissue swelling. Disc levels: Chronic degenerative spondylosis throughout the cervical region with disc space narrowing and small osteophytes. Facet osteoarthritis at C7-T1 left worse than right. Upper chest: Negative Other: None IMPRESSION: Head CT: No acute or traumatic finding. Generalized atrophy with frontal and temporal predominance. Chronic small-vessel disease. Cervical spine CT: No acute or traumatic finding. Chronic degenerative spondylosis and left worse than right C7-T1 facet arthritis. Electronically Signed   By: Nelson Chimes M.D.   On: 10/20/2018 16:05   Dg Pelvis Portable  Result Date: 10/20/2018 CLINICAL DATA:  Pain status post fall EXAM:  PORTABLE PELVIS 1-2 VIEWS COMPARISON:  None. FINDINGS: There is no evidence of pelvic fracture or diastasis. No pelvic bone lesions are seen. There is a large amount of stool in the rectum. IMPRESSION: 1. No acute osseous abnormality. 2. Large amount of stool in the rectum Electronically Signed   By: Constance Holster M.D.   On: 10/20/2018 16:26   Dg Chest Portable 1 View  Result Date: 10/20/2018 CLINICAL DATA:  Fall EXAM: PORTABLE CHEST 1 VIEW COMPARISON:  None. FINDINGS: The heart size and mediastinal contours are within normal limits. Both lungs are clear. The visualized skeletal structures are unremarkable. There are advanced degenerative changes of both glenohumeral joints. IMPRESSION: No active disease. Electronically Signed   By: Constance Holster M.D.   On: 10/20/2018 16:25    ____________________________________________   PROCEDURES  Procedure(s) performed (including Critical Care):  Procedures   ____________________________________________   INITIAL IMPRESSION / ASSESSMENT AND PLAN / ED COURSE    Jerusalen Mateja was evaluated in Emergency Department on 10/20/2018 for the symptoms described in the history of present illness. She was evaluated in the context of the global COVID-19 pandemic, which necessitated consideration that the patient might be at risk for infection with the SARS-CoV-2 virus that causes COVID-19. Institutional protocols and algorithms that pertain to the evaluation of patients at risk  for COVID-19 are in a state of rapid change based on information released by regulatory bodies including the CDC and federal and state organizations. These policies and algorithms were followed during the patient's care in the ED.    Immediate Considerations in trauma patient: Head Injury will get CT head to evaluate Cervical injury will get CT cervical to evaluate No obvious injury to suggest extremity fracture although it somewhat limited due to patient not be able to  communicate however we did range her joints and did not see any large bruises except for a small bruise on her right hip. Given patient has a history of dementia but this is most likely mechanical fall however will get some basic labs and EKG to further evaluate.  Unclear why there is some drainage out of her right ear ever but staff are confident this occurred prior to fall.  However TM does appear to be slightly bulging with some vesicle on it, patient is afebrile and has no white count but will consider treatment for otitis media. Vs bollous myringitis.  Low suspicion for this being CSF given onset prior to fall and CT head negative. Will give ENT referral, culture fluid, and send on course of augmentin.    Labs are reassuring with normal white count.  Hemoglobin is stable.  CT head and neck are negative for acute fractures. X-rays are negative for fracture.  Patient continues to be at baseline.  Will discuss with facility and send patient back.      ____________________________________________   FINAL CLINICAL IMPRESSION(S) / ED DIAGNOSES   Final diagnoses:  Fall  Fall, initial encounter  Bullous myringitis of right ear      MEDICATIONS GIVEN DURING THIS VISIT:  Medications - No data to display   ED Discharge Orders         Ordered    amoxicillin-clavulanate (AUGMENTIN) 875-125 MG tablet  2 times daily     10/20/18 1733           Note:  This document was prepared using Dragon voice recognition software and may include unintentional dictation errors.   Vanessa [place], MD 10/20/18 [phone]

## 2018-10-20 NOTE — Discharge Instructions (Addendum)
CT head was negative.  Labs were at baseline.  Unclear what is causing the drainage out of the right ear.  Possible otitis media versus bullous myringitis.  Will give patient a course of antibiotics.  Wound was cultured and they will call you back if It is positive.  Otherwise you should call ENT and have a follow-up within the week.  Keep a close eye on it and if she develops redness around the back of her ear fevers or any other concerns she should return to the ER.

## 2018-10-26 LAB — AEROBIC/ANAEROBIC CULTURE W GRAM STAIN (SURGICAL/DEEP WOUND)

## 2018-11-05 DIAGNOSIS — F0281 Dementia in other diseases classified elsewhere with behavioral disturbance: Secondary | ICD-10-CM | POA: Diagnosis not present

## 2018-11-05 DIAGNOSIS — I10 Essential (primary) hypertension: Secondary | ICD-10-CM | POA: Diagnosis not present

## 2018-11-05 DIAGNOSIS — Z593 Problems related to living in residential institution: Secondary | ICD-10-CM | POA: Diagnosis not present

## 2018-11-05 DIAGNOSIS — G301 Alzheimer's disease with late onset: Secondary | ICD-10-CM | POA: Diagnosis not present

## 2018-11-05 DIAGNOSIS — F325 Major depressive disorder, single episode, in full remission: Secondary | ICD-10-CM | POA: Diagnosis not present

## 2018-11-17 ENCOUNTER — Encounter
Admission: RE | Admit: 2018-11-17 | Discharge: 2018-11-17 | Disposition: A | Discharge status: Home / Self Care | Payer: Medicare Other | Source: Ambulatory Visit | Attending: Internal Medicine | Admitting: Internal Medicine

## 2018-12-18 ENCOUNTER — Encounter
Admission: RE | Admit: 2018-12-18 | Discharge: 2018-12-18 | Disposition: A | Discharge status: Home / Self Care | Payer: Medicare Other | Source: Ambulatory Visit | Attending: Internal Medicine | Admitting: Internal Medicine

## 2019-01-25 ENCOUNTER — Encounter
Admission: RE | Admit: 2019-01-25 | Discharge: 2019-01-25 | Disposition: A | Discharge status: Home / Self Care | Payer: Medicare Other | Source: Ambulatory Visit | Attending: Internal Medicine | Admitting: Internal Medicine

## 2019-02-16 DIAGNOSIS — M6281 Muscle weakness (generalized): Secondary | ICD-10-CM | POA: Diagnosis not present

## 2019-02-16 DIAGNOSIS — Z9183 Wandering in diseases classified elsewhere: Secondary | ICD-10-CM | POA: Diagnosis not present

## 2019-02-16 DIAGNOSIS — G301 Alzheimer's disease with late onset: Secondary | ICD-10-CM | POA: Diagnosis not present

## 2019-02-16 DIAGNOSIS — R262 Difficulty in walking, not elsewhere classified: Secondary | ICD-10-CM | POA: Diagnosis not present

## 2019-02-16 DIAGNOSIS — F0280 Dementia in other diseases classified elsewhere without behavioral disturbance: Secondary | ICD-10-CM | POA: Diagnosis not present

## 2019-02-16 DIAGNOSIS — G8929 Other chronic pain: Secondary | ICD-10-CM | POA: Diagnosis not present

## 2019-02-18 DIAGNOSIS — G301 Alzheimer's disease with late onset: Secondary | ICD-10-CM | POA: Diagnosis not present

## 2019-02-18 DIAGNOSIS — Z9183 Wandering in diseases classified elsewhere: Secondary | ICD-10-CM | POA: Diagnosis not present

## 2019-02-18 DIAGNOSIS — F0280 Dementia in other diseases classified elsewhere without behavioral disturbance: Secondary | ICD-10-CM | POA: Diagnosis not present

## 2019-02-18 DIAGNOSIS — G8929 Other chronic pain: Secondary | ICD-10-CM | POA: Diagnosis not present

## 2019-02-18 DIAGNOSIS — R262 Difficulty in walking, not elsewhere classified: Secondary | ICD-10-CM | POA: Diagnosis not present

## 2019-02-18 DIAGNOSIS — M6281 Muscle weakness (generalized): Secondary | ICD-10-CM | POA: Diagnosis not present

## 2019-02-19 DIAGNOSIS — R262 Difficulty in walking, not elsewhere classified: Secondary | ICD-10-CM | POA: Diagnosis not present

## 2019-02-19 DIAGNOSIS — Z9183 Wandering in diseases classified elsewhere: Secondary | ICD-10-CM | POA: Diagnosis not present

## 2019-02-19 DIAGNOSIS — G8929 Other chronic pain: Secondary | ICD-10-CM | POA: Diagnosis not present

## 2019-02-19 DIAGNOSIS — G301 Alzheimer's disease with late onset: Secondary | ICD-10-CM | POA: Diagnosis not present

## 2019-02-19 DIAGNOSIS — F0280 Dementia in other diseases classified elsewhere without behavioral disturbance: Secondary | ICD-10-CM | POA: Diagnosis not present

## 2019-02-19 DIAGNOSIS — M6281 Muscle weakness (generalized): Secondary | ICD-10-CM | POA: Diagnosis not present

## 2019-02-23 DIAGNOSIS — F0280 Dementia in other diseases classified elsewhere without behavioral disturbance: Secondary | ICD-10-CM | POA: Diagnosis not present

## 2019-02-23 DIAGNOSIS — R262 Difficulty in walking, not elsewhere classified: Secondary | ICD-10-CM | POA: Diagnosis not present

## 2019-02-23 DIAGNOSIS — G8929 Other chronic pain: Secondary | ICD-10-CM | POA: Diagnosis not present

## 2019-02-23 DIAGNOSIS — M6281 Muscle weakness (generalized): Secondary | ICD-10-CM | POA: Diagnosis not present

## 2019-02-23 DIAGNOSIS — G301 Alzheimer's disease with late onset: Secondary | ICD-10-CM | POA: Diagnosis not present

## 2019-02-23 DIAGNOSIS — Z9183 Wandering in diseases classified elsewhere: Secondary | ICD-10-CM | POA: Diagnosis not present

## 2019-02-24 DIAGNOSIS — G8929 Other chronic pain: Secondary | ICD-10-CM | POA: Diagnosis not present

## 2019-02-24 DIAGNOSIS — R262 Difficulty in walking, not elsewhere classified: Secondary | ICD-10-CM | POA: Diagnosis not present

## 2019-02-24 DIAGNOSIS — M6281 Muscle weakness (generalized): Secondary | ICD-10-CM | POA: Diagnosis not present

## 2019-02-24 DIAGNOSIS — Z9183 Wandering in diseases classified elsewhere: Secondary | ICD-10-CM | POA: Diagnosis not present

## 2019-02-24 DIAGNOSIS — G301 Alzheimer's disease with late onset: Secondary | ICD-10-CM | POA: Diagnosis not present

## 2019-02-24 DIAGNOSIS — F0280 Dementia in other diseases classified elsewhere without behavioral disturbance: Secondary | ICD-10-CM | POA: Diagnosis not present

## 2019-02-25 DIAGNOSIS — Z9183 Wandering in diseases classified elsewhere: Secondary | ICD-10-CM | POA: Diagnosis not present

## 2019-02-25 DIAGNOSIS — G8929 Other chronic pain: Secondary | ICD-10-CM | POA: Diagnosis not present

## 2019-02-25 DIAGNOSIS — G301 Alzheimer's disease with late onset: Secondary | ICD-10-CM | POA: Diagnosis not present

## 2019-02-25 DIAGNOSIS — R262 Difficulty in walking, not elsewhere classified: Secondary | ICD-10-CM | POA: Diagnosis not present

## 2019-02-25 DIAGNOSIS — M6281 Muscle weakness (generalized): Secondary | ICD-10-CM | POA: Diagnosis not present

## 2019-02-25 DIAGNOSIS — F0280 Dementia in other diseases classified elsewhere without behavioral disturbance: Secondary | ICD-10-CM | POA: Diagnosis not present

## 2019-03-01 DIAGNOSIS — F325 Major depressive disorder, single episode, in full remission: Secondary | ICD-10-CM | POA: Diagnosis not present

## 2019-03-01 DIAGNOSIS — Z593 Problems related to living in residential institution: Secondary | ICD-10-CM | POA: Diagnosis not present

## 2019-03-01 DIAGNOSIS — G301 Alzheimer's disease with late onset: Secondary | ICD-10-CM | POA: Diagnosis not present

## 2019-03-01 DIAGNOSIS — F0281 Dementia in other diseases classified elsewhere with behavioral disturbance: Secondary | ICD-10-CM | POA: Diagnosis not present

## 2019-03-01 DIAGNOSIS — I10 Essential (primary) hypertension: Secondary | ICD-10-CM | POA: Diagnosis not present

## 2019-03-03 DIAGNOSIS — Z20828 Contact with and (suspected) exposure to other viral communicable diseases: Secondary | ICD-10-CM | POA: Diagnosis not present

## 2019-03-03 DIAGNOSIS — G301 Alzheimer's disease with late onset: Secondary | ICD-10-CM | POA: Diagnosis not present

## 2019-03-03 DIAGNOSIS — Z9183 Wandering in diseases classified elsewhere: Secondary | ICD-10-CM | POA: Diagnosis not present

## 2019-03-03 DIAGNOSIS — M6281 Muscle weakness (generalized): Secondary | ICD-10-CM | POA: Diagnosis not present

## 2019-03-03 DIAGNOSIS — R262 Difficulty in walking, not elsewhere classified: Secondary | ICD-10-CM | POA: Diagnosis not present

## 2019-03-03 DIAGNOSIS — F0280 Dementia in other diseases classified elsewhere without behavioral disturbance: Secondary | ICD-10-CM | POA: Diagnosis not present

## 2019-03-03 DIAGNOSIS — G8929 Other chronic pain: Secondary | ICD-10-CM | POA: Diagnosis not present

## 2019-03-08 DIAGNOSIS — Z20828 Contact with and (suspected) exposure to other viral communicable diseases: Secondary | ICD-10-CM | POA: Diagnosis not present

## 2019-03-15 DIAGNOSIS — Z20828 Contact with and (suspected) exposure to other viral communicable diseases: Secondary | ICD-10-CM | POA: Diagnosis not present

## 2019-03-22 DIAGNOSIS — F0281 Dementia in other diseases classified elsewhere with behavioral disturbance: Secondary | ICD-10-CM | POA: Diagnosis not present

## 2019-03-22 DIAGNOSIS — R279 Unspecified lack of coordination: Secondary | ICD-10-CM | POA: Diagnosis not present

## 2019-03-22 DIAGNOSIS — F0280 Dementia in other diseases classified elsewhere without behavioral disturbance: Secondary | ICD-10-CM | POA: Diagnosis not present

## 2019-03-22 DIAGNOSIS — M6281 Muscle weakness (generalized): Secondary | ICD-10-CM | POA: Diagnosis not present

## 2019-03-22 DIAGNOSIS — G301 Alzheimer's disease with late onset: Secondary | ICD-10-CM | POA: Diagnosis not present

## 2019-03-22 DIAGNOSIS — R2689 Other abnormalities of gait and mobility: Secondary | ICD-10-CM | POA: Diagnosis not present

## 2019-03-22 DIAGNOSIS — R262 Difficulty in walking, not elsewhere classified: Secondary | ICD-10-CM | POA: Diagnosis not present

## 2019-03-22 DIAGNOSIS — R1310 Dysphagia, unspecified: Secondary | ICD-10-CM | POA: Diagnosis not present

## 2019-03-24 DIAGNOSIS — R262 Difficulty in walking, not elsewhere classified: Secondary | ICD-10-CM | POA: Diagnosis not present

## 2019-03-24 DIAGNOSIS — F0281 Dementia in other diseases classified elsewhere with behavioral disturbance: Secondary | ICD-10-CM | POA: Diagnosis not present

## 2019-03-24 DIAGNOSIS — R2689 Other abnormalities of gait and mobility: Secondary | ICD-10-CM | POA: Diagnosis not present

## 2019-03-24 DIAGNOSIS — R279 Unspecified lack of coordination: Secondary | ICD-10-CM | POA: Diagnosis not present

## 2019-03-24 DIAGNOSIS — G301 Alzheimer's disease with late onset: Secondary | ICD-10-CM | POA: Diagnosis not present

## 2019-03-24 DIAGNOSIS — M6281 Muscle weakness (generalized): Secondary | ICD-10-CM | POA: Diagnosis not present

## 2019-03-26 DIAGNOSIS — Z23 Encounter for immunization: Secondary | ICD-10-CM | POA: Diagnosis not present

## 2019-03-29 DIAGNOSIS — M6281 Muscle weakness (generalized): Secondary | ICD-10-CM | POA: Diagnosis not present

## 2019-03-29 DIAGNOSIS — F0281 Dementia in other diseases classified elsewhere with behavioral disturbance: Secondary | ICD-10-CM | POA: Diagnosis not present

## 2019-03-29 DIAGNOSIS — G301 Alzheimer's disease with late onset: Secondary | ICD-10-CM | POA: Diagnosis not present

## 2019-03-29 DIAGNOSIS — R2689 Other abnormalities of gait and mobility: Secondary | ICD-10-CM | POA: Diagnosis not present

## 2019-03-29 DIAGNOSIS — R262 Difficulty in walking, not elsewhere classified: Secondary | ICD-10-CM | POA: Diagnosis not present

## 2019-03-29 DIAGNOSIS — Z20828 Contact with and (suspected) exposure to other viral communicable diseases: Secondary | ICD-10-CM | POA: Diagnosis not present

## 2019-03-29 DIAGNOSIS — R279 Unspecified lack of coordination: Secondary | ICD-10-CM | POA: Diagnosis not present

## 2019-04-01 DIAGNOSIS — G301 Alzheimer's disease with late onset: Secondary | ICD-10-CM | POA: Diagnosis not present

## 2019-04-01 DIAGNOSIS — M6281 Muscle weakness (generalized): Secondary | ICD-10-CM | POA: Diagnosis not present

## 2019-04-01 DIAGNOSIS — R279 Unspecified lack of coordination: Secondary | ICD-10-CM | POA: Diagnosis not present

## 2019-04-01 DIAGNOSIS — R262 Difficulty in walking, not elsewhere classified: Secondary | ICD-10-CM | POA: Diagnosis not present

## 2019-04-01 DIAGNOSIS — R2689 Other abnormalities of gait and mobility: Secondary | ICD-10-CM | POA: Diagnosis not present

## 2019-04-01 DIAGNOSIS — F0281 Dementia in other diseases classified elsewhere with behavioral disturbance: Secondary | ICD-10-CM | POA: Diagnosis not present

## 2019-04-05 DIAGNOSIS — Z20828 Contact with and (suspected) exposure to other viral communicable diseases: Secondary | ICD-10-CM | POA: Diagnosis not present

## 2019-04-07 DIAGNOSIS — R2689 Other abnormalities of gait and mobility: Secondary | ICD-10-CM | POA: Diagnosis not present

## 2019-04-07 DIAGNOSIS — R262 Difficulty in walking, not elsewhere classified: Secondary | ICD-10-CM | POA: Diagnosis not present

## 2019-04-07 DIAGNOSIS — G301 Alzheimer's disease with late onset: Secondary | ICD-10-CM | POA: Diagnosis not present

## 2019-04-07 DIAGNOSIS — R279 Unspecified lack of coordination: Secondary | ICD-10-CM | POA: Diagnosis not present

## 2019-04-07 DIAGNOSIS — F0281 Dementia in other diseases classified elsewhere with behavioral disturbance: Secondary | ICD-10-CM | POA: Diagnosis not present

## 2019-04-07 DIAGNOSIS — M6281 Muscle weakness (generalized): Secondary | ICD-10-CM | POA: Diagnosis not present

## 2019-04-09 DIAGNOSIS — F0281 Dementia in other diseases classified elsewhere with behavioral disturbance: Secondary | ICD-10-CM | POA: Diagnosis not present

## 2019-04-09 DIAGNOSIS — M6281 Muscle weakness (generalized): Secondary | ICD-10-CM | POA: Diagnosis not present

## 2019-04-09 DIAGNOSIS — G301 Alzheimer's disease with late onset: Secondary | ICD-10-CM | POA: Diagnosis not present

## 2019-04-09 DIAGNOSIS — R279 Unspecified lack of coordination: Secondary | ICD-10-CM | POA: Diagnosis not present

## 2019-04-09 DIAGNOSIS — R2689 Other abnormalities of gait and mobility: Secondary | ICD-10-CM | POA: Diagnosis not present

## 2019-04-09 DIAGNOSIS — R262 Difficulty in walking, not elsewhere classified: Secondary | ICD-10-CM | POA: Diagnosis not present

## 2019-04-12 DIAGNOSIS — F0281 Dementia in other diseases classified elsewhere with behavioral disturbance: Secondary | ICD-10-CM | POA: Diagnosis not present

## 2019-04-12 DIAGNOSIS — R2689 Other abnormalities of gait and mobility: Secondary | ICD-10-CM | POA: Diagnosis not present

## 2019-04-12 DIAGNOSIS — R279 Unspecified lack of coordination: Secondary | ICD-10-CM | POA: Diagnosis not present

## 2019-04-12 DIAGNOSIS — M6281 Muscle weakness (generalized): Secondary | ICD-10-CM | POA: Diagnosis not present

## 2019-04-12 DIAGNOSIS — R262 Difficulty in walking, not elsewhere classified: Secondary | ICD-10-CM | POA: Diagnosis not present

## 2019-04-12 DIAGNOSIS — G301 Alzheimer's disease with late onset: Secondary | ICD-10-CM | POA: Diagnosis not present

## 2019-04-13 DIAGNOSIS — R262 Difficulty in walking, not elsewhere classified: Secondary | ICD-10-CM | POA: Diagnosis not present

## 2019-04-13 DIAGNOSIS — G301 Alzheimer's disease with late onset: Secondary | ICD-10-CM | POA: Diagnosis not present

## 2019-04-13 DIAGNOSIS — F0281 Dementia in other diseases classified elsewhere with behavioral disturbance: Secondary | ICD-10-CM | POA: Diagnosis not present

## 2019-04-13 DIAGNOSIS — M6281 Muscle weakness (generalized): Secondary | ICD-10-CM | POA: Diagnosis not present

## 2019-04-13 DIAGNOSIS — R279 Unspecified lack of coordination: Secondary | ICD-10-CM | POA: Diagnosis not present

## 2019-04-13 DIAGNOSIS — R2689 Other abnormalities of gait and mobility: Secondary | ICD-10-CM | POA: Diagnosis not present

## 2019-04-14 DIAGNOSIS — R262 Difficulty in walking, not elsewhere classified: Secondary | ICD-10-CM | POA: Diagnosis not present

## 2019-04-14 DIAGNOSIS — M6281 Muscle weakness (generalized): Secondary | ICD-10-CM | POA: Diagnosis not present

## 2019-04-14 DIAGNOSIS — R279 Unspecified lack of coordination: Secondary | ICD-10-CM | POA: Diagnosis not present

## 2019-04-14 DIAGNOSIS — F0281 Dementia in other diseases classified elsewhere with behavioral disturbance: Secondary | ICD-10-CM | POA: Diagnosis not present

## 2019-04-14 DIAGNOSIS — R2689 Other abnormalities of gait and mobility: Secondary | ICD-10-CM | POA: Diagnosis not present

## 2019-04-14 DIAGNOSIS — G301 Alzheimer's disease with late onset: Secondary | ICD-10-CM | POA: Diagnosis not present

## 2019-04-15 DIAGNOSIS — R262 Difficulty in walking, not elsewhere classified: Secondary | ICD-10-CM | POA: Diagnosis not present

## 2019-04-15 DIAGNOSIS — R279 Unspecified lack of coordination: Secondary | ICD-10-CM | POA: Diagnosis not present

## 2019-04-15 DIAGNOSIS — G301 Alzheimer's disease with late onset: Secondary | ICD-10-CM | POA: Diagnosis not present

## 2019-04-15 DIAGNOSIS — Z20828 Contact with and (suspected) exposure to other viral communicable diseases: Secondary | ICD-10-CM | POA: Diagnosis not present

## 2019-04-15 DIAGNOSIS — R2689 Other abnormalities of gait and mobility: Secondary | ICD-10-CM | POA: Diagnosis not present

## 2019-04-15 DIAGNOSIS — F0281 Dementia in other diseases classified elsewhere with behavioral disturbance: Secondary | ICD-10-CM | POA: Diagnosis not present

## 2019-04-15 DIAGNOSIS — M6281 Muscle weakness (generalized): Secondary | ICD-10-CM | POA: Diagnosis not present

## 2019-04-16 DIAGNOSIS — G301 Alzheimer's disease with late onset: Secondary | ICD-10-CM | POA: Diagnosis not present

## 2019-04-16 DIAGNOSIS — R2689 Other abnormalities of gait and mobility: Secondary | ICD-10-CM | POA: Diagnosis not present

## 2019-04-16 DIAGNOSIS — M6281 Muscle weakness (generalized): Secondary | ICD-10-CM | POA: Diagnosis not present

## 2019-04-16 DIAGNOSIS — F0281 Dementia in other diseases classified elsewhere with behavioral disturbance: Secondary | ICD-10-CM | POA: Diagnosis not present

## 2019-04-16 DIAGNOSIS — R279 Unspecified lack of coordination: Secondary | ICD-10-CM | POA: Diagnosis not present

## 2019-04-16 DIAGNOSIS — R262 Difficulty in walking, not elsewhere classified: Secondary | ICD-10-CM | POA: Diagnosis not present

## 2019-04-19 DIAGNOSIS — F0280 Dementia in other diseases classified elsewhere without behavioral disturbance: Secondary | ICD-10-CM | POA: Diagnosis not present

## 2019-04-19 DIAGNOSIS — G8929 Other chronic pain: Secondary | ICD-10-CM | POA: Diagnosis not present

## 2019-04-19 DIAGNOSIS — Z9183 Wandering in diseases classified elsewhere: Secondary | ICD-10-CM | POA: Diagnosis not present

## 2019-04-19 DIAGNOSIS — R279 Unspecified lack of coordination: Secondary | ICD-10-CM | POA: Diagnosis not present

## 2019-04-19 DIAGNOSIS — M6281 Muscle weakness (generalized): Secondary | ICD-10-CM | POA: Diagnosis not present

## 2019-04-19 DIAGNOSIS — R1310 Dysphagia, unspecified: Secondary | ICD-10-CM | POA: Diagnosis not present

## 2019-04-19 DIAGNOSIS — R2689 Other abnormalities of gait and mobility: Secondary | ICD-10-CM | POA: Diagnosis not present

## 2019-04-19 DIAGNOSIS — G301 Alzheimer's disease with late onset: Secondary | ICD-10-CM | POA: Diagnosis not present

## 2019-04-19 DIAGNOSIS — R262 Difficulty in walking, not elsewhere classified: Secondary | ICD-10-CM | POA: Diagnosis not present

## 2019-04-20 DIAGNOSIS — Z20828 Contact with and (suspected) exposure to other viral communicable diseases: Secondary | ICD-10-CM | POA: Diagnosis not present

## 2019-04-20 DIAGNOSIS — G301 Alzheimer's disease with late onset: Secondary | ICD-10-CM | POA: Diagnosis not present

## 2019-04-20 DIAGNOSIS — R2689 Other abnormalities of gait and mobility: Secondary | ICD-10-CM | POA: Diagnosis not present

## 2019-04-20 DIAGNOSIS — R262 Difficulty in walking, not elsewhere classified: Secondary | ICD-10-CM | POA: Diagnosis not present

## 2019-04-20 DIAGNOSIS — R1310 Dysphagia, unspecified: Secondary | ICD-10-CM | POA: Diagnosis not present

## 2019-04-20 DIAGNOSIS — M6281 Muscle weakness (generalized): Secondary | ICD-10-CM | POA: Diagnosis not present

## 2019-04-20 DIAGNOSIS — F0280 Dementia in other diseases classified elsewhere without behavioral disturbance: Secondary | ICD-10-CM | POA: Diagnosis not present

## 2019-04-21 DIAGNOSIS — F0280 Dementia in other diseases classified elsewhere without behavioral disturbance: Secondary | ICD-10-CM | POA: Diagnosis not present

## 2019-04-21 DIAGNOSIS — M6281 Muscle weakness (generalized): Secondary | ICD-10-CM | POA: Diagnosis not present

## 2019-04-21 DIAGNOSIS — R262 Difficulty in walking, not elsewhere classified: Secondary | ICD-10-CM | POA: Diagnosis not present

## 2019-04-21 DIAGNOSIS — R2689 Other abnormalities of gait and mobility: Secondary | ICD-10-CM | POA: Diagnosis not present

## 2019-04-21 DIAGNOSIS — G301 Alzheimer's disease with late onset: Secondary | ICD-10-CM | POA: Diagnosis not present

## 2019-04-21 DIAGNOSIS — R1310 Dysphagia, unspecified: Secondary | ICD-10-CM | POA: Diagnosis not present

## 2019-04-22 DIAGNOSIS — R2689 Other abnormalities of gait and mobility: Secondary | ICD-10-CM | POA: Diagnosis not present

## 2019-04-22 DIAGNOSIS — G301 Alzheimer's disease with late onset: Secondary | ICD-10-CM | POA: Diagnosis not present

## 2019-04-22 DIAGNOSIS — F0280 Dementia in other diseases classified elsewhere without behavioral disturbance: Secondary | ICD-10-CM | POA: Diagnosis not present

## 2019-04-22 DIAGNOSIS — R262 Difficulty in walking, not elsewhere classified: Secondary | ICD-10-CM | POA: Diagnosis not present

## 2019-04-22 DIAGNOSIS — R1310 Dysphagia, unspecified: Secondary | ICD-10-CM | POA: Diagnosis not present

## 2019-04-22 DIAGNOSIS — M6281 Muscle weakness (generalized): Secondary | ICD-10-CM | POA: Diagnosis not present

## 2019-04-23 DIAGNOSIS — R1310 Dysphagia, unspecified: Secondary | ICD-10-CM | POA: Diagnosis not present

## 2019-04-23 DIAGNOSIS — M6281 Muscle weakness (generalized): Secondary | ICD-10-CM | POA: Diagnosis not present

## 2019-04-23 DIAGNOSIS — F0280 Dementia in other diseases classified elsewhere without behavioral disturbance: Secondary | ICD-10-CM | POA: Diagnosis not present

## 2019-04-23 DIAGNOSIS — Z23 Encounter for immunization: Secondary | ICD-10-CM | POA: Diagnosis not present

## 2019-04-23 DIAGNOSIS — G301 Alzheimer's disease with late onset: Secondary | ICD-10-CM | POA: Diagnosis not present

## 2019-04-23 DIAGNOSIS — R2689 Other abnormalities of gait and mobility: Secondary | ICD-10-CM | POA: Diagnosis not present

## 2019-04-23 DIAGNOSIS — R262 Difficulty in walking, not elsewhere classified: Secondary | ICD-10-CM | POA: Diagnosis not present

## 2019-04-26 DIAGNOSIS — R262 Difficulty in walking, not elsewhere classified: Secondary | ICD-10-CM | POA: Diagnosis not present

## 2019-04-26 DIAGNOSIS — R2689 Other abnormalities of gait and mobility: Secondary | ICD-10-CM | POA: Diagnosis not present

## 2019-04-26 DIAGNOSIS — M6281 Muscle weakness (generalized): Secondary | ICD-10-CM | POA: Diagnosis not present

## 2019-04-26 DIAGNOSIS — G301 Alzheimer's disease with late onset: Secondary | ICD-10-CM | POA: Diagnosis not present

## 2019-04-26 DIAGNOSIS — R1310 Dysphagia, unspecified: Secondary | ICD-10-CM | POA: Diagnosis not present

## 2019-04-26 DIAGNOSIS — F0280 Dementia in other diseases classified elsewhere without behavioral disturbance: Secondary | ICD-10-CM | POA: Diagnosis not present

## 2019-04-26 DIAGNOSIS — Z20828 Contact with and (suspected) exposure to other viral communicable diseases: Secondary | ICD-10-CM | POA: Diagnosis not present

## 2019-04-27 DIAGNOSIS — R1310 Dysphagia, unspecified: Secondary | ICD-10-CM | POA: Diagnosis not present

## 2019-04-27 DIAGNOSIS — R262 Difficulty in walking, not elsewhere classified: Secondary | ICD-10-CM | POA: Diagnosis not present

## 2019-04-27 DIAGNOSIS — R2689 Other abnormalities of gait and mobility: Secondary | ICD-10-CM | POA: Diagnosis not present

## 2019-04-27 DIAGNOSIS — F0280 Dementia in other diseases classified elsewhere without behavioral disturbance: Secondary | ICD-10-CM | POA: Diagnosis not present

## 2019-04-27 DIAGNOSIS — G301 Alzheimer's disease with late onset: Secondary | ICD-10-CM | POA: Diagnosis not present

## 2019-04-27 DIAGNOSIS — M6281 Muscle weakness (generalized): Secondary | ICD-10-CM | POA: Diagnosis not present

## 2019-04-28 DIAGNOSIS — R262 Difficulty in walking, not elsewhere classified: Secondary | ICD-10-CM | POA: Diagnosis not present

## 2019-04-28 DIAGNOSIS — G301 Alzheimer's disease with late onset: Secondary | ICD-10-CM | POA: Diagnosis not present

## 2019-04-28 DIAGNOSIS — M6281 Muscle weakness (generalized): Secondary | ICD-10-CM | POA: Diagnosis not present

## 2019-04-28 DIAGNOSIS — R2689 Other abnormalities of gait and mobility: Secondary | ICD-10-CM | POA: Diagnosis not present

## 2019-04-28 DIAGNOSIS — R1310 Dysphagia, unspecified: Secondary | ICD-10-CM | POA: Diagnosis not present

## 2019-04-28 DIAGNOSIS — F0280 Dementia in other diseases classified elsewhere without behavioral disturbance: Secondary | ICD-10-CM | POA: Diagnosis not present

## 2019-04-29 DIAGNOSIS — F0280 Dementia in other diseases classified elsewhere without behavioral disturbance: Secondary | ICD-10-CM | POA: Diagnosis not present

## 2019-04-29 DIAGNOSIS — R2689 Other abnormalities of gait and mobility: Secondary | ICD-10-CM | POA: Diagnosis not present

## 2019-04-29 DIAGNOSIS — R262 Difficulty in walking, not elsewhere classified: Secondary | ICD-10-CM | POA: Diagnosis not present

## 2019-04-29 DIAGNOSIS — M6281 Muscle weakness (generalized): Secondary | ICD-10-CM | POA: Diagnosis not present

## 2019-04-29 DIAGNOSIS — R1310 Dysphagia, unspecified: Secondary | ICD-10-CM | POA: Diagnosis not present

## 2019-04-29 DIAGNOSIS — G301 Alzheimer's disease with late onset: Secondary | ICD-10-CM | POA: Diagnosis not present

## 2019-04-30 DIAGNOSIS — G301 Alzheimer's disease with late onset: Secondary | ICD-10-CM | POA: Diagnosis not present

## 2019-04-30 DIAGNOSIS — R262 Difficulty in walking, not elsewhere classified: Secondary | ICD-10-CM | POA: Diagnosis not present

## 2019-04-30 DIAGNOSIS — M6281 Muscle weakness (generalized): Secondary | ICD-10-CM | POA: Diagnosis not present

## 2019-04-30 DIAGNOSIS — R1310 Dysphagia, unspecified: Secondary | ICD-10-CM | POA: Diagnosis not present

## 2019-04-30 DIAGNOSIS — R2689 Other abnormalities of gait and mobility: Secondary | ICD-10-CM | POA: Diagnosis not present

## 2019-04-30 DIAGNOSIS — F0280 Dementia in other diseases classified elsewhere without behavioral disturbance: Secondary | ICD-10-CM | POA: Diagnosis not present

## 2019-05-04 DIAGNOSIS — M6281 Muscle weakness (generalized): Secondary | ICD-10-CM | POA: Diagnosis not present

## 2019-05-04 DIAGNOSIS — G301 Alzheimer's disease with late onset: Secondary | ICD-10-CM | POA: Diagnosis not present

## 2019-05-04 DIAGNOSIS — R262 Difficulty in walking, not elsewhere classified: Secondary | ICD-10-CM | POA: Diagnosis not present

## 2019-05-04 DIAGNOSIS — R2689 Other abnormalities of gait and mobility: Secondary | ICD-10-CM | POA: Diagnosis not present

## 2019-05-04 DIAGNOSIS — R1310 Dysphagia, unspecified: Secondary | ICD-10-CM | POA: Diagnosis not present

## 2019-05-04 DIAGNOSIS — F0280 Dementia in other diseases classified elsewhere without behavioral disturbance: Secondary | ICD-10-CM | POA: Diagnosis not present

## 2019-05-05 DIAGNOSIS — R262 Difficulty in walking, not elsewhere classified: Secondary | ICD-10-CM | POA: Diagnosis not present

## 2019-05-05 DIAGNOSIS — F0280 Dementia in other diseases classified elsewhere without behavioral disturbance: Secondary | ICD-10-CM | POA: Diagnosis not present

## 2019-05-05 DIAGNOSIS — G301 Alzheimer's disease with late onset: Secondary | ICD-10-CM | POA: Diagnosis not present

## 2019-05-05 DIAGNOSIS — R2689 Other abnormalities of gait and mobility: Secondary | ICD-10-CM | POA: Diagnosis not present

## 2019-05-05 DIAGNOSIS — R1310 Dysphagia, unspecified: Secondary | ICD-10-CM | POA: Diagnosis not present

## 2019-05-05 DIAGNOSIS — M6281 Muscle weakness (generalized): Secondary | ICD-10-CM | POA: Diagnosis not present

## 2019-05-06 ENCOUNTER — Encounter
Admission: RE | Admit: 2019-05-06 | Discharge: 2019-05-06 | Disposition: A | Discharge status: Home / Self Care | Payer: Medicare Other | Source: Ambulatory Visit | Attending: Internal Medicine | Admitting: Internal Medicine

## 2019-05-06 DIAGNOSIS — M6281 Muscle weakness (generalized): Secondary | ICD-10-CM | POA: Diagnosis not present

## 2019-05-06 DIAGNOSIS — F0280 Dementia in other diseases classified elsewhere without behavioral disturbance: Secondary | ICD-10-CM | POA: Diagnosis not present

## 2019-05-06 DIAGNOSIS — R262 Difficulty in walking, not elsewhere classified: Secondary | ICD-10-CM | POA: Diagnosis not present

## 2019-05-06 DIAGNOSIS — R2689 Other abnormalities of gait and mobility: Secondary | ICD-10-CM | POA: Diagnosis not present

## 2019-05-06 DIAGNOSIS — R1310 Dysphagia, unspecified: Secondary | ICD-10-CM | POA: Diagnosis not present

## 2019-05-06 DIAGNOSIS — G301 Alzheimer's disease with late onset: Secondary | ICD-10-CM | POA: Diagnosis not present

## 2019-05-07 DIAGNOSIS — R262 Difficulty in walking, not elsewhere classified: Secondary | ICD-10-CM | POA: Diagnosis not present

## 2019-05-07 DIAGNOSIS — M6281 Muscle weakness (generalized): Secondary | ICD-10-CM | POA: Diagnosis not present

## 2019-05-07 DIAGNOSIS — G301 Alzheimer's disease with late onset: Secondary | ICD-10-CM | POA: Diagnosis not present

## 2019-05-07 DIAGNOSIS — R1310 Dysphagia, unspecified: Secondary | ICD-10-CM | POA: Diagnosis not present

## 2019-05-07 DIAGNOSIS — F0280 Dementia in other diseases classified elsewhere without behavioral disturbance: Secondary | ICD-10-CM | POA: Diagnosis not present

## 2019-05-07 DIAGNOSIS — R2689 Other abnormalities of gait and mobility: Secondary | ICD-10-CM | POA: Diagnosis not present

## 2019-05-11 DIAGNOSIS — R262 Difficulty in walking, not elsewhere classified: Secondary | ICD-10-CM | POA: Diagnosis not present

## 2019-05-11 DIAGNOSIS — R1310 Dysphagia, unspecified: Secondary | ICD-10-CM | POA: Diagnosis not present

## 2019-05-11 DIAGNOSIS — G301 Alzheimer's disease with late onset: Secondary | ICD-10-CM | POA: Diagnosis not present

## 2019-05-11 DIAGNOSIS — F0280 Dementia in other diseases classified elsewhere without behavioral disturbance: Secondary | ICD-10-CM | POA: Diagnosis not present

## 2019-05-11 DIAGNOSIS — M6281 Muscle weakness (generalized): Secondary | ICD-10-CM | POA: Diagnosis not present

## 2019-05-11 DIAGNOSIS — R2689 Other abnormalities of gait and mobility: Secondary | ICD-10-CM | POA: Diagnosis not present

## 2019-05-13 DIAGNOSIS — G301 Alzheimer's disease with late onset: Secondary | ICD-10-CM | POA: Diagnosis not present

## 2019-05-13 DIAGNOSIS — R1310 Dysphagia, unspecified: Secondary | ICD-10-CM | POA: Diagnosis not present

## 2019-05-13 DIAGNOSIS — R2689 Other abnormalities of gait and mobility: Secondary | ICD-10-CM | POA: Diagnosis not present

## 2019-05-13 DIAGNOSIS — M6281 Muscle weakness (generalized): Secondary | ICD-10-CM | POA: Diagnosis not present

## 2019-05-13 DIAGNOSIS — R262 Difficulty in walking, not elsewhere classified: Secondary | ICD-10-CM | POA: Diagnosis not present

## 2019-05-13 DIAGNOSIS — F0280 Dementia in other diseases classified elsewhere without behavioral disturbance: Secondary | ICD-10-CM | POA: Diagnosis not present

## 2019-05-20 DIAGNOSIS — Z9183 Wandering in diseases classified elsewhere: Secondary | ICD-10-CM | POA: Diagnosis not present

## 2019-05-20 DIAGNOSIS — M6281 Muscle weakness (generalized): Secondary | ICD-10-CM | POA: Diagnosis not present

## 2019-05-20 DIAGNOSIS — R262 Difficulty in walking, not elsewhere classified: Secondary | ICD-10-CM | POA: Diagnosis not present

## 2019-05-20 DIAGNOSIS — G301 Alzheimer's disease with late onset: Secondary | ICD-10-CM | POA: Diagnosis not present

## 2019-05-20 DIAGNOSIS — I10 Essential (primary) hypertension: Secondary | ICD-10-CM | POA: Diagnosis not present

## 2019-05-20 DIAGNOSIS — R279 Unspecified lack of coordination: Secondary | ICD-10-CM | POA: Diagnosis not present

## 2019-05-20 DIAGNOSIS — F325 Major depressive disorder, single episode, in full remission: Secondary | ICD-10-CM | POA: Diagnosis not present

## 2019-05-20 DIAGNOSIS — G8929 Other chronic pain: Secondary | ICD-10-CM | POA: Diagnosis not present

## 2019-05-20 DIAGNOSIS — Z23 Encounter for immunization: Secondary | ICD-10-CM | POA: Diagnosis not present

## 2019-05-20 DIAGNOSIS — R2689 Other abnormalities of gait and mobility: Secondary | ICD-10-CM | POA: Diagnosis not present

## 2019-05-20 DIAGNOSIS — R1310 Dysphagia, unspecified: Secondary | ICD-10-CM | POA: Diagnosis not present

## 2019-05-20 DIAGNOSIS — M8949 Other hypertrophic osteoarthropathy, multiple sites: Secondary | ICD-10-CM | POA: Diagnosis not present

## 2019-05-20 DIAGNOSIS — F419 Anxiety disorder, unspecified: Secondary | ICD-10-CM | POA: Diagnosis not present

## 2019-05-20 DIAGNOSIS — Z0000 Encounter for general adult medical examination without abnormal findings: Secondary | ICD-10-CM | POA: Diagnosis not present

## 2019-05-20 DIAGNOSIS — E876 Hypokalemia: Secondary | ICD-10-CM | POA: Diagnosis not present

## 2019-05-20 DIAGNOSIS — H73011 Bullous myringitis, right ear: Secondary | ICD-10-CM | POA: Diagnosis not present

## 2019-05-20 DIAGNOSIS — F0281 Dementia in other diseases classified elsewhere with behavioral disturbance: Secondary | ICD-10-CM | POA: Diagnosis not present

## 2019-05-20 DIAGNOSIS — Z1159 Encounter for screening for other viral diseases: Secondary | ICD-10-CM | POA: Diagnosis not present

## 2019-05-20 DIAGNOSIS — Z20828 Contact with and (suspected) exposure to other viral communicable diseases: Secondary | ICD-10-CM | POA: Diagnosis not present

## 2019-05-20 DIAGNOSIS — F0280 Dementia in other diseases classified elsewhere without behavioral disturbance: Secondary | ICD-10-CM | POA: Diagnosis not present

## 2019-05-20 DIAGNOSIS — Z789 Other specified health status: Secondary | ICD-10-CM | POA: Diagnosis not present

## 2019-05-24 DIAGNOSIS — G301 Alzheimer's disease with late onset: Secondary | ICD-10-CM | POA: Diagnosis not present

## 2019-05-24 DIAGNOSIS — F325 Major depressive disorder, single episode, in full remission: Secondary | ICD-10-CM | POA: Diagnosis not present

## 2019-05-24 DIAGNOSIS — Z789 Other specified health status: Secondary | ICD-10-CM | POA: Diagnosis not present

## 2019-05-24 DIAGNOSIS — I10 Essential (primary) hypertension: Secondary | ICD-10-CM | POA: Diagnosis not present

## 2019-05-24 DIAGNOSIS — F0281 Dementia in other diseases classified elsewhere with behavioral disturbance: Secondary | ICD-10-CM | POA: Diagnosis not present

## 2019-07-15 DIAGNOSIS — G301 Alzheimer's disease with late onset: Secondary | ICD-10-CM | POA: Diagnosis not present

## 2019-07-15 DIAGNOSIS — Z0000 Encounter for general adult medical examination without abnormal findings: Secondary | ICD-10-CM | POA: Diagnosis not present

## 2019-07-15 DIAGNOSIS — Z789 Other specified health status: Secondary | ICD-10-CM | POA: Diagnosis not present

## 2019-07-15 DIAGNOSIS — F0281 Dementia in other diseases classified elsewhere with behavioral disturbance: Secondary | ICD-10-CM | POA: Diagnosis not present

## 2019-07-15 DIAGNOSIS — F325 Major depressive disorder, single episode, in full remission: Secondary | ICD-10-CM | POA: Diagnosis not present

## 2019-07-15 DIAGNOSIS — I10 Essential (primary) hypertension: Secondary | ICD-10-CM | POA: Diagnosis not present

## 2019-07-19 ENCOUNTER — Encounter
Admission: RE | Admit: 2019-07-19 | Discharge: 2019-07-19 | Disposition: A | Discharge status: Home / Self Care | Payer: Medicare Other | Source: Ambulatory Visit | Attending: Internal Medicine | Admitting: Internal Medicine

## 2019-09-01 ENCOUNTER — Encounter
Admission: RE | Admit: 2019-09-01 | Discharge: 2019-09-01 | Disposition: A | Discharge status: Home / Self Care | Payer: Medicare Other | Source: Ambulatory Visit | Attending: Internal Medicine | Admitting: Internal Medicine

## 2019-09-07 IMAGING — CT CT HEAD WITHOUT CONTRAST
3 of 7 series · 12 of 47 positions shown, 15 images · non-contrast
Comparison: None.

CLINICAL DATA: Dementia patient who fell in the bathroom.

EXAM:
CT HEAD WITHOUT CONTRAST
CT CERVICAL SPINE WITHOUT CONTRAST
TECHNIQUE: Multidetector CT imaging of the head and cervical spine was
performed following the standard protocol without intravenous
contrast. Multiplanar CT image reconstructions of the cervical spine
were also generated.

[Series 8: sagittal bone · sagittal · 0.18mm/px · 1 of 45 slices shown, 2 images]
[im 23/45  brain]
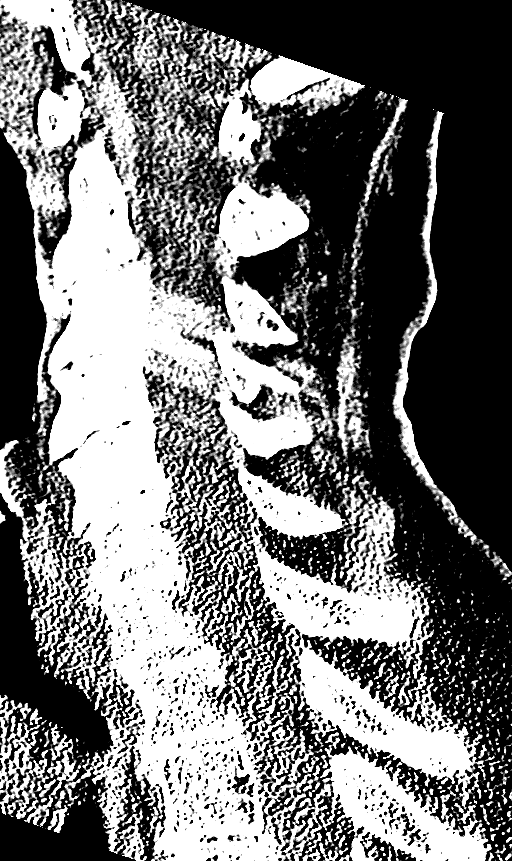
[im 23/45  bone]
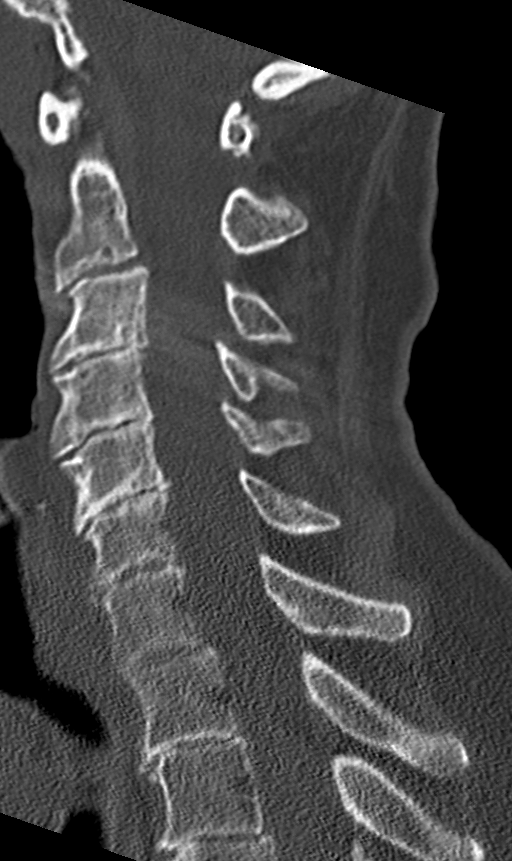

[Series 9: coronal bone · coronal · 0.17mm/px · 3 of 47 slices shown]
[im 18/47  bone]
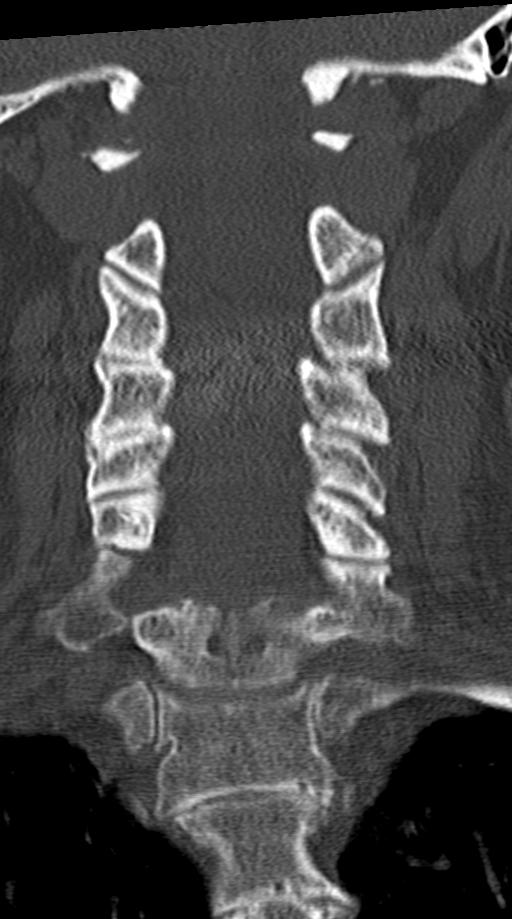
[im 24/47  bone]
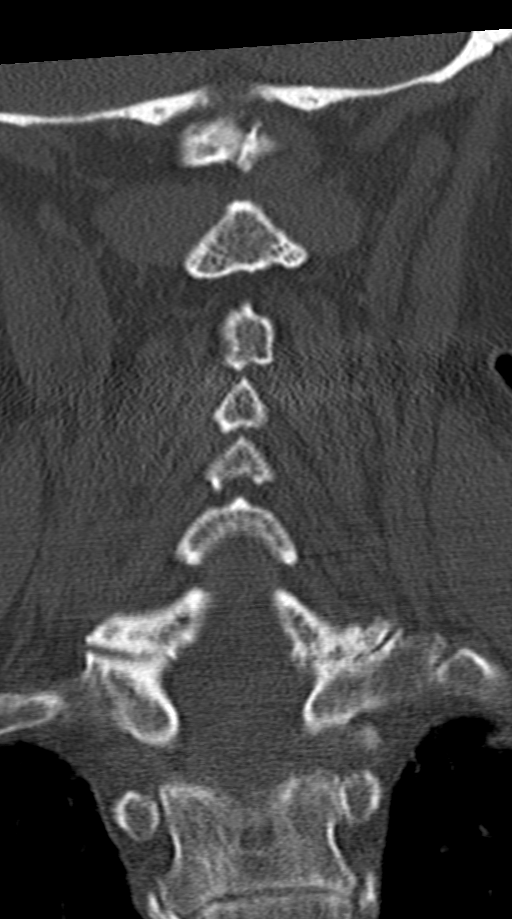
[im 29/47  bone]
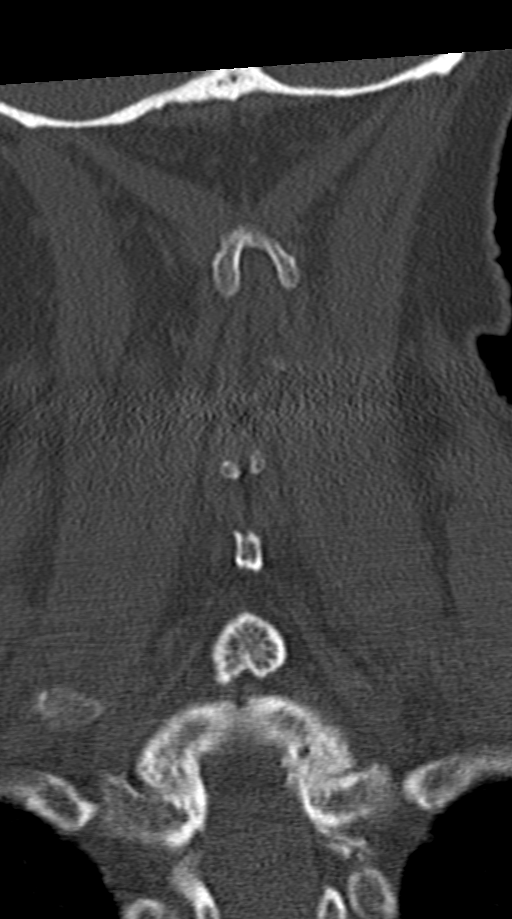

[Series 10: orthogonal bone · axial · 0.17mm/px · z∈[-274,-149]mm · 8 of 80 slices shown, 10 images]
[im 7/80  brain]
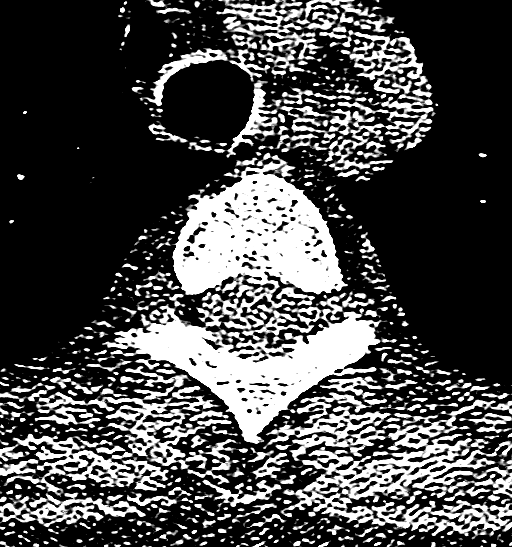
[im 7/80  bone]
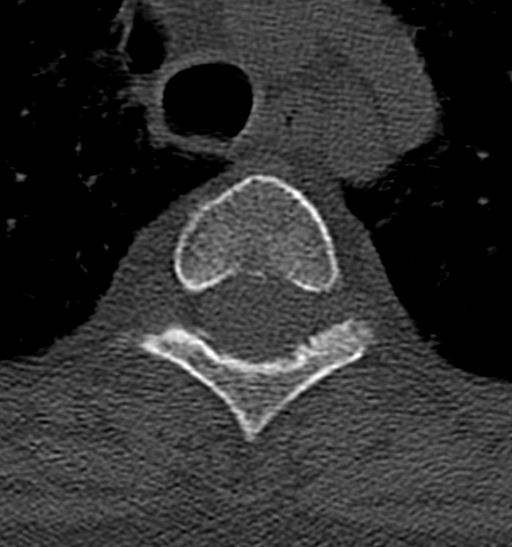
[im 20/80  bone]
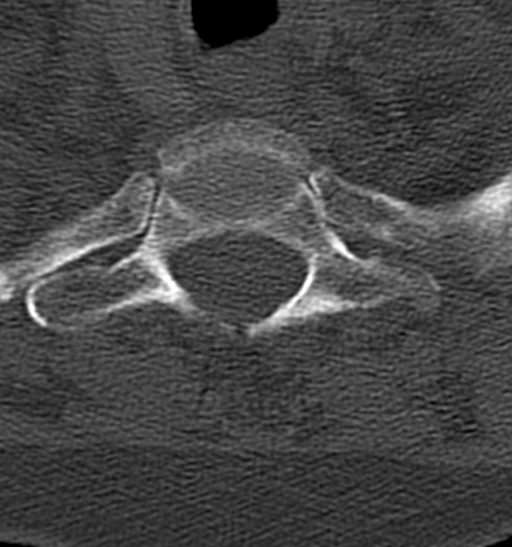
[im 27/80  bone]
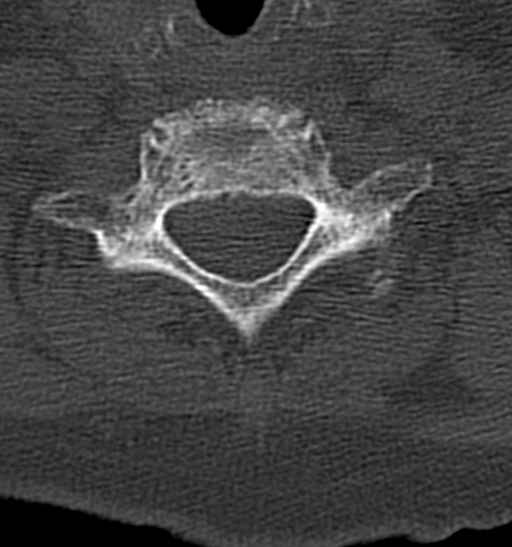
[im 33/80  bone]
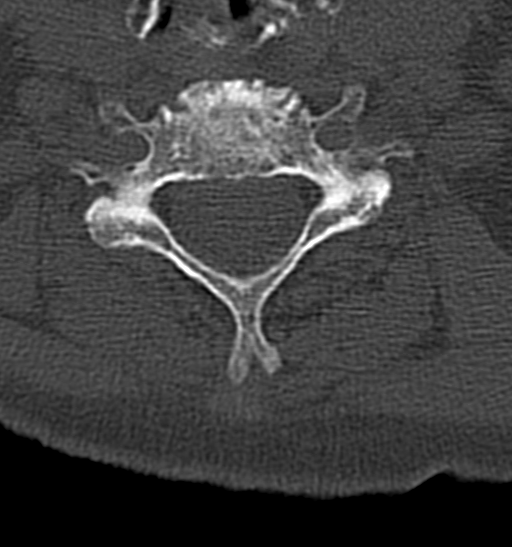
[im 47/80  brain]
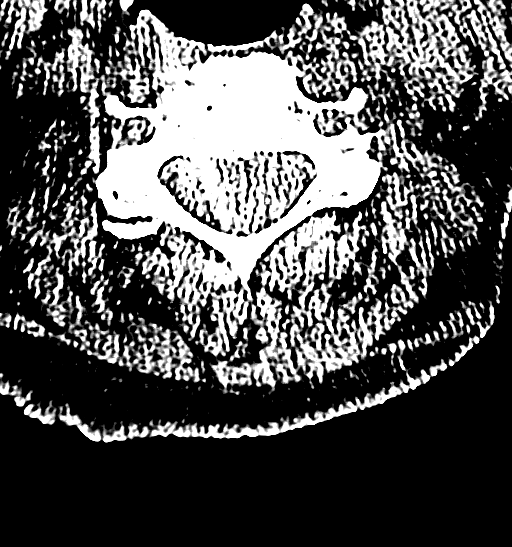
[im 47/80  bone]
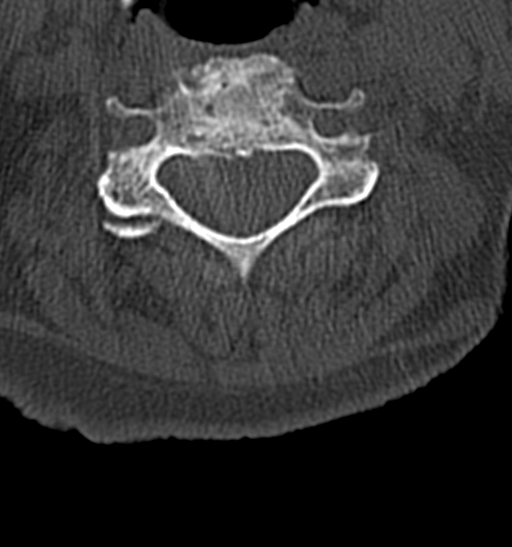
[im 53/80  bone]
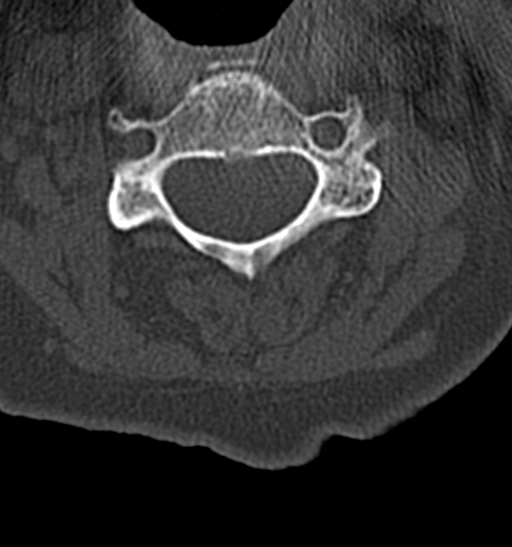
[im 60/80  bone]
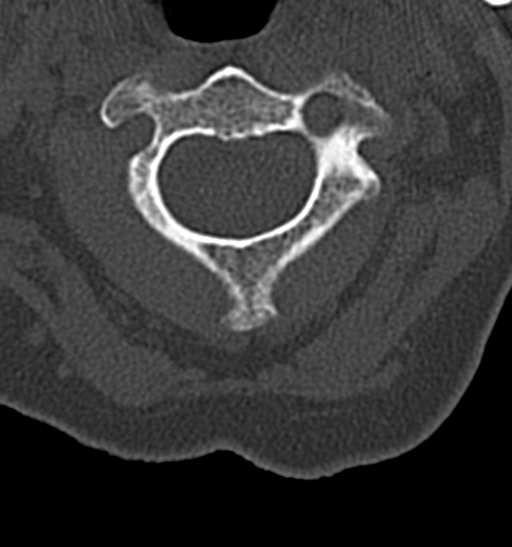
[im 73/80  bone]
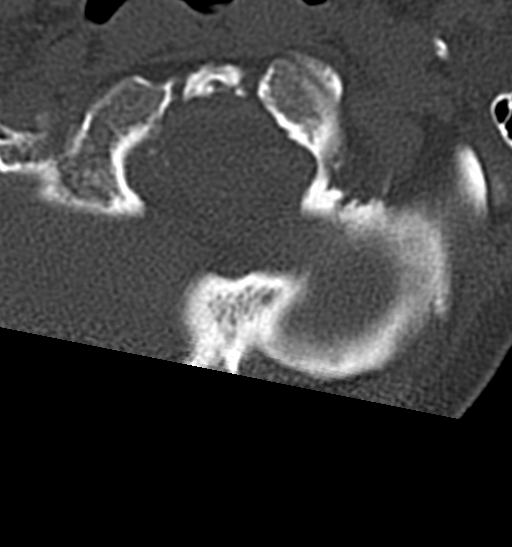

[12 of 47 positions shown; findings below may reference images not displayed]

FINDINGS: CT HEAD FINDINGS

Brain: Generalized brain atrophy with some frontal and temporal
predominance. Chronic small-vessel ischemic changes of the white
matter. No sign of acute infarction, mass lesion, hemorrhage,
hydrocephalus or extra-axial collection.

Vascular: There is atherosclerotic calcification of the major
vessels at the base of the brain.

Skull: No skull fracture.

Sinuses/Orbits: Clear/normal

Other: None

CT CERVICAL SPINE FINDINGS

Alignment: No traumatic malalignment.

Skull base and vertebrae: No fracture.

Soft tissues and spinal canal: No soft tissue swelling.

Disc levels: Chronic degenerative spondylosis throughout the
cervical region with disc space narrowing and small osteophytes.
Facet osteoarthritis at C7-T1 left worse than right.

Upper chest: Negative

Other: None
IMPRESSION: Head CT: No acute or traumatic finding. Generalized atrophy with
frontal and temporal predominance. Chronic small-vessel disease.

Cervical spine CT: No acute or traumatic finding. Chronic
degenerative spondylosis and left worse than right C7-T1 facet
arthritis.

## 2019-09-07 IMAGING — CR PORTABLE PELVIS 1-2 VIEWS
1 series · 1 of 1 positions shown · non-contrast
Comparison: None.

CLINICAL DATA: Pain status post fall

EXAM:
PORTABLE PELVIS 1-2 VIEWS

[pelvis ap]
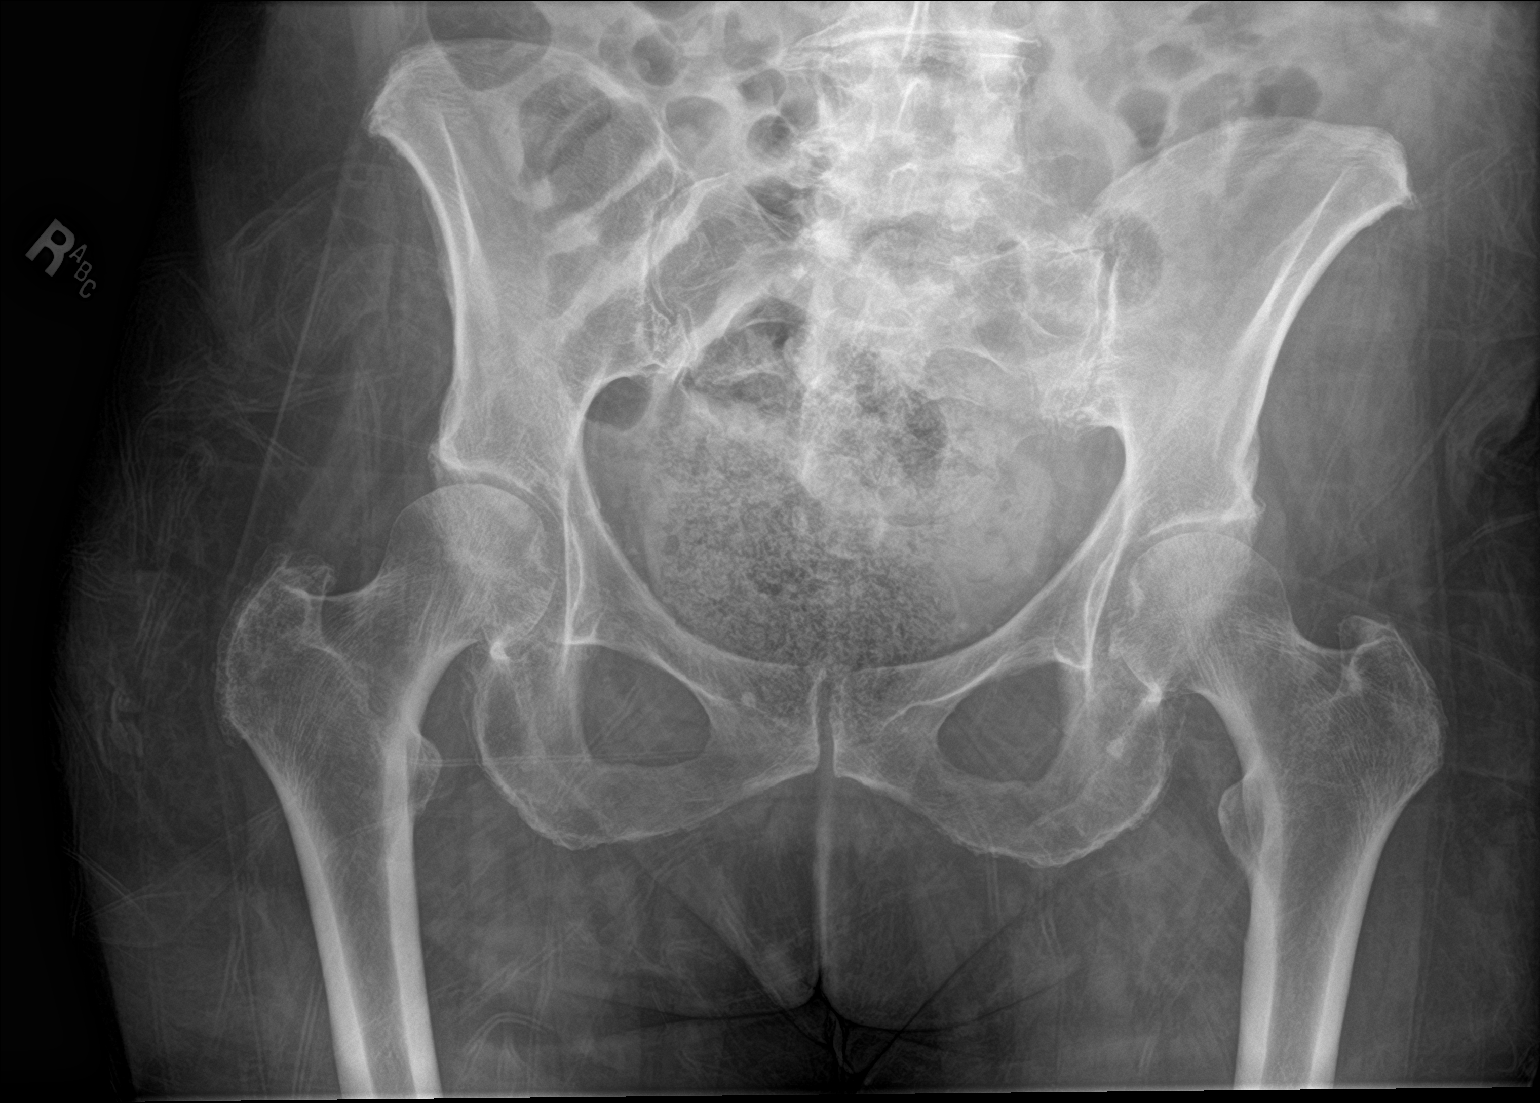

[1 of 1 positions shown; findings below may reference images not displayed]

FINDINGS: There is no evidence of pelvic fracture or diastasis. No pelvic bone
lesions are seen. There is a large amount of stool in the rectum.
IMPRESSION: 1. No acute osseous abnormality.
2. Large amount of stool in the rectum

## 2019-10-12 DIAGNOSIS — Z789 Other specified health status: Secondary | ICD-10-CM | POA: Diagnosis not present

## 2019-10-12 DIAGNOSIS — G301 Alzheimer's disease with late onset: Secondary | ICD-10-CM | POA: Diagnosis not present

## 2019-10-12 DIAGNOSIS — F325 Major depressive disorder, single episode, in full remission: Secondary | ICD-10-CM | POA: Diagnosis not present

## 2019-10-12 DIAGNOSIS — F0281 Dementia in other diseases classified elsewhere with behavioral disturbance: Secondary | ICD-10-CM | POA: Diagnosis not present

## 2019-11-23 ENCOUNTER — Encounter
Admission: RE | Admit: 2019-11-23 | Discharge: 2019-11-23 | Disposition: A | Discharge status: Home / Self Care | Payer: Medicare Other | Source: Ambulatory Visit | Attending: Internal Medicine | Admitting: Internal Medicine

## 2019-11-24 DIAGNOSIS — F0281 Dementia in other diseases classified elsewhere with behavioral disturbance: Secondary | ICD-10-CM | POA: Diagnosis not present

## 2019-11-24 DIAGNOSIS — F325 Major depressive disorder, single episode, in full remission: Secondary | ICD-10-CM | POA: Diagnosis not present

## 2019-11-24 DIAGNOSIS — G301 Alzheimer's disease with late onset: Secondary | ICD-10-CM | POA: Diagnosis not present

## 2019-11-24 DIAGNOSIS — Z789 Other specified health status: Secondary | ICD-10-CM | POA: Diagnosis not present

## 2019-11-24 DIAGNOSIS — I10 Essential (primary) hypertension: Secondary | ICD-10-CM | POA: Diagnosis not present

## 2020-01-18 DIAGNOSIS — F325 Major depressive disorder, single episode, in full remission: Secondary | ICD-10-CM | POA: Diagnosis not present

## 2020-01-18 DIAGNOSIS — Z789 Other specified health status: Secondary | ICD-10-CM | POA: Diagnosis not present

## 2020-01-18 DIAGNOSIS — F0281 Dementia in other diseases classified elsewhere with behavioral disturbance: Secondary | ICD-10-CM | POA: Diagnosis not present

## 2020-01-18 DIAGNOSIS — G301 Alzheimer's disease with late onset: Secondary | ICD-10-CM | POA: Diagnosis not present

## 2020-01-18 DIAGNOSIS — M8949 Other hypertrophic osteoarthropathy, multiple sites: Secondary | ICD-10-CM | POA: Diagnosis not present

## 2020-03-13 DIAGNOSIS — M8949 Other hypertrophic osteoarthropathy, multiple sites: Secondary | ICD-10-CM | POA: Diagnosis not present

## 2020-03-13 DIAGNOSIS — F325 Major depressive disorder, single episode, in full remission: Secondary | ICD-10-CM | POA: Diagnosis not present

## 2020-03-13 DIAGNOSIS — G301 Alzheimer's disease with late onset: Secondary | ICD-10-CM | POA: Diagnosis not present

## 2020-03-13 DIAGNOSIS — Z789 Other specified health status: Secondary | ICD-10-CM | POA: Diagnosis not present

## 2020-03-13 DIAGNOSIS — F0281 Dementia in other diseases classified elsewhere with behavioral disturbance: Secondary | ICD-10-CM | POA: Diagnosis not present

## 2020-03-13 DIAGNOSIS — I10 Essential (primary) hypertension: Secondary | ICD-10-CM | POA: Diagnosis not present

## 2022-01-16 DEATH — deceased
